# Patient Record
Sex: Female | Born: 1950
Health system: Southern US, Community
[De-identification: ages and names within clinical notes are randomized; demographics above are authoritative.]

## PROBLEM LIST (undated history)

## (undated) DIAGNOSIS — Z789 Other specified health status: Secondary | ICD-10-CM

## (undated) DIAGNOSIS — C801 Malignant (primary) neoplasm, unspecified: Secondary | ICD-10-CM

## (undated) DIAGNOSIS — Z923 Personal history of irradiation: Secondary | ICD-10-CM

## (undated) HISTORY — DX: Malignant (primary) neoplasm, unspecified: C80.1

## (undated) HISTORY — PX: ROTATOR CUFF REPAIR: SHX139

---

## 1985-07-19 HISTORY — PX: CHOLECYSTECTOMY: SHX55

## 1998-11-14 ENCOUNTER — Other Ambulatory Visit: Admission: RE | Admit: 1998-11-14 | Discharge: 1998-11-14 | Payer: Self-pay | Admitting: *Deleted

## 1998-12-30 ENCOUNTER — Ambulatory Visit (HOSPITAL_BASED_OUTPATIENT_CLINIC_OR_DEPARTMENT_OTHER): Admission: RE | Admit: 1998-12-30 | Discharge: 1998-12-30 | Payer: Self-pay | Admitting: Orthopedic Surgery

## 1999-08-26 ENCOUNTER — Other Ambulatory Visit: Admission: RE | Admit: 1999-08-26 | Discharge: 1999-08-26 | Payer: Self-pay | Admitting: Gynecology

## 1999-09-11 ENCOUNTER — Ambulatory Visit (HOSPITAL_COMMUNITY): Admission: RE | Admit: 1999-09-11 | Discharge: 1999-09-11 | Payer: Self-pay | Admitting: Gastroenterology

## 1999-09-18 ENCOUNTER — Encounter: Payer: Self-pay | Admitting: Gynecology

## 1999-09-18 ENCOUNTER — Encounter: Admission: RE | Admit: 1999-09-18 | Discharge: 1999-09-18 | Payer: Self-pay | Admitting: *Deleted

## 2000-09-14 ENCOUNTER — Other Ambulatory Visit: Admission: RE | Admit: 2000-09-14 | Discharge: 2000-09-14 | Payer: Self-pay | Admitting: Gynecology

## 2000-09-21 ENCOUNTER — Encounter: Admission: RE | Admit: 2000-09-21 | Discharge: 2000-09-21 | Payer: Self-pay | Admitting: Gynecology

## 2000-09-21 ENCOUNTER — Encounter: Payer: Self-pay | Admitting: Gynecology

## 2000-10-27 ENCOUNTER — Encounter (INDEPENDENT_AMBULATORY_CARE_PROVIDER_SITE_OTHER): Payer: Self-pay

## 2000-10-27 ENCOUNTER — Other Ambulatory Visit: Admission: RE | Admit: 2000-10-27 | Discharge: 2000-10-27 | Payer: Self-pay | Admitting: Gynecology

## 2001-09-19 ENCOUNTER — Other Ambulatory Visit: Admission: RE | Admit: 2001-09-19 | Discharge: 2001-09-19 | Payer: Self-pay | Admitting: Gynecology

## 2001-09-26 ENCOUNTER — Encounter: Payer: Self-pay | Admitting: Gynecology

## 2001-09-26 ENCOUNTER — Encounter: Admission: RE | Admit: 2001-09-26 | Discharge: 2001-09-26 | Payer: Self-pay | Admitting: Gynecology

## 2002-09-27 ENCOUNTER — Other Ambulatory Visit: Admission: RE | Admit: 2002-09-27 | Discharge: 2002-09-27 | Payer: Self-pay | Admitting: Gynecology

## 2002-10-09 ENCOUNTER — Encounter: Payer: Self-pay | Admitting: Gynecology

## 2002-10-09 ENCOUNTER — Encounter: Admission: RE | Admit: 2002-10-09 | Discharge: 2002-10-09 | Payer: Self-pay | Admitting: Gynecology

## 2002-12-27 ENCOUNTER — Encounter: Payer: Self-pay | Admitting: Orthopedic Surgery

## 2002-12-27 ENCOUNTER — Encounter: Admission: RE | Admit: 2002-12-27 | Discharge: 2002-12-27 | Payer: Self-pay | Admitting: Orthopedic Surgery

## 2003-01-08 ENCOUNTER — Ambulatory Visit: Admission: RE | Admit: 2003-01-08 | Discharge: 2003-01-08 | Payer: Self-pay | Admitting: Internal Medicine

## 2003-10-10 ENCOUNTER — Encounter: Admission: RE | Admit: 2003-10-10 | Discharge: 2003-10-10 | Payer: Self-pay | Admitting: Gynecology

## 2003-10-23 ENCOUNTER — Other Ambulatory Visit: Admission: RE | Admit: 2003-10-23 | Discharge: 2003-10-23 | Payer: Self-pay | Admitting: Gynecology

## 2004-10-21 ENCOUNTER — Other Ambulatory Visit: Admission: RE | Admit: 2004-10-21 | Discharge: 2004-10-21 | Payer: Self-pay | Admitting: Gynecology

## 2004-10-21 ENCOUNTER — Encounter: Admission: RE | Admit: 2004-10-21 | Discharge: 2004-10-21 | Payer: Self-pay | Admitting: Gynecology

## 2005-10-28 ENCOUNTER — Encounter: Admission: RE | Admit: 2005-10-28 | Discharge: 2005-10-28 | Payer: Self-pay | Admitting: Gynecology

## 2005-11-04 ENCOUNTER — Other Ambulatory Visit: Admission: RE | Admit: 2005-11-04 | Discharge: 2005-11-04 | Payer: Self-pay | Admitting: Gynecology

## 2006-10-31 ENCOUNTER — Encounter: Admission: RE | Admit: 2006-10-31 | Discharge: 2006-10-31 | Payer: Self-pay | Admitting: Gynecology

## 2006-11-08 ENCOUNTER — Other Ambulatory Visit: Admission: RE | Admit: 2006-11-08 | Discharge: 2006-11-08 | Payer: Self-pay | Admitting: Gynecology

## 2007-11-02 ENCOUNTER — Encounter: Admission: RE | Admit: 2007-11-02 | Discharge: 2007-11-02 | Payer: Self-pay | Admitting: Gynecology

## 2007-11-09 ENCOUNTER — Other Ambulatory Visit: Admission: RE | Admit: 2007-11-09 | Discharge: 2007-11-09 | Payer: Self-pay | Admitting: Gynecology

## 2008-11-04 ENCOUNTER — Encounter: Admission: RE | Admit: 2008-11-04 | Discharge: 2008-11-04 | Payer: Self-pay | Admitting: Gynecology

## 2009-11-12 ENCOUNTER — Encounter: Admission: RE | Admit: 2009-11-12 | Discharge: 2009-11-12 | Payer: Self-pay | Admitting: Gynecology

## 2010-07-19 DIAGNOSIS — C50919 Malignant neoplasm of unspecified site of unspecified female breast: Secondary | ICD-10-CM

## 2010-07-19 DIAGNOSIS — C801 Malignant (primary) neoplasm, unspecified: Secondary | ICD-10-CM

## 2010-07-19 HISTORY — DX: Malignant neoplasm of unspecified site of unspecified female breast: C50.919

## 2010-07-19 HISTORY — DX: Malignant (primary) neoplasm, unspecified: C80.1

## 2010-08-09 ENCOUNTER — Encounter: Payer: Self-pay | Admitting: Gynecology

## 2010-10-07 ENCOUNTER — Ambulatory Visit: Payer: Self-pay | Admitting: Sports Medicine

## 2010-10-13 ENCOUNTER — Other Ambulatory Visit: Payer: Self-pay | Admitting: Gynecology

## 2010-10-13 DIAGNOSIS — Z1231 Encounter for screening mammogram for malignant neoplasm of breast: Secondary | ICD-10-CM

## 2010-10-21 ENCOUNTER — Ambulatory Visit: Payer: Self-pay | Admitting: Sports Medicine

## 2010-10-21 ENCOUNTER — Encounter: Payer: Self-pay | Admitting: Sports Medicine

## 2010-10-21 ENCOUNTER — Ambulatory Visit (INDEPENDENT_AMBULATORY_CARE_PROVIDER_SITE_OTHER): Payer: 59 | Admitting: Sports Medicine

## 2010-10-21 VITALS — BP 120/70 | Ht 67.0 in | Wt 142.0 lb

## 2010-10-21 DIAGNOSIS — M7661 Achilles tendinitis, right leg: Secondary | ICD-10-CM | POA: Insufficient documentation

## 2010-10-21 DIAGNOSIS — M7660 Achilles tendinitis, unspecified leg: Secondary | ICD-10-CM

## 2010-10-21 MED ORDER — KETOPROFEN POWD: Status: DC

## 2010-10-21 NOTE — Progress Notes (Signed)
  Subjective:    Patient ID: Mary Bartlett, female    DOB: 08-Sep-1950, 60 y.o.   MRN: [id number]  HPI Plays tennis and walks;  Used to run. Walks 3 to 5 days per week 4 miles Plays tennis once weekly usually on hard courts Some golf and body pump and 1 day of weights  RT AT has been hurting for 8 mos or so Husband is orthopedist so she was getting some iontophoresis and icing at his office Felt better in office but is having more symptoms and not getting complete relief with physical therapy.  Started training for [employer] w more walking and this flared somewhat.  She notices a tender nodule on her right Achilles that has been there for an indefinite period of time  Review of Systems Hx of Rotator cuff repair RT and LT Hx of PF on right she thinks years ago  Only meds calcium and vitamins     Objective:   Physical Exam    patient is pleasant cooperative and in no acute distress.  Leg lengths equal. Alignment is good. Walking gait is normal.  Right Achilles tendon appears normal at the insertion in the calf muscle appears normal. However 5 cm above the insertion of the heel there is the beginning of a moderately tender nodule that is thickened and extends for 1-1/2 cm the length of the Achilles. Left Achilles tendon appears normal at the insertion and the calf muscle appears normal. There is some slight thickening about 4-5 cm above the heel insertion but no nodule is appreciated. There is no tenderness to squeeze.  Foot and arch shape are normal. Posterior tibialis function is intact. Lower extremity strength including hip abduction is excellent.  Of interest she has limited plantar flexion of the right foot when compared to the left but dorsiflexion is normal bilaterally. The plantar fascial insertion of both heels is nontender today.    Musculoskeletal ultrasound  On the right Achilles tendon at the insertion the AP left is 0.38 cm. At 5 cm above the heel there is a  nodule that is 0.59 cm on the AP with. In the central portion of this nodule is a linear calcification. This is noted on both long and transverse scan. There are some small areas of hypoechoic change but no frank tears appreciated. There is no abnormal Doppler flow within the tendon but there is some increased Doppler flow in the soft tissue surrounding the nodule.  Comparison the left Achilles tendon is 0.43 at the insertion and is only 0.46 at 5 cm above the insertion with no nodular thickening noted and no calcification.  Of interest is that the right plantar fascia still demonstrates a spur on scanning and some calcification within the plantar fascia but its thickness is normal at 0.4 left plantar fascia is normal as well.    Assessment & Plan:

## 2010-11-16 ENCOUNTER — Ambulatory Visit
Admission: RE | Admit: 2010-11-16 | Discharge: 2010-11-16 | Disposition: A | Discharge status: Home / Self Care | Payer: 59 | Source: Ambulatory Visit | Attending: Gynecology | Admitting: Gynecology

## 2010-11-16 DIAGNOSIS — Z1231 Encounter for screening mammogram for malignant neoplasm of breast: Secondary | ICD-10-CM

## 2010-11-17 ENCOUNTER — Other Ambulatory Visit: Payer: Self-pay | Admitting: Gynecology

## 2010-11-17 DIAGNOSIS — R928 Other abnormal and inconclusive findings on diagnostic imaging of breast: Secondary | ICD-10-CM

## 2010-11-17 HISTORY — PX: BREAST SURGERY: SHX581

## 2010-11-18 ENCOUNTER — Ambulatory Visit
Admission: RE | Admit: 2010-11-18 | Discharge: 2010-11-18 | Disposition: A | Discharge status: Home / Self Care | Payer: 59 | Source: Ambulatory Visit | Attending: Gynecology | Admitting: Gynecology

## 2010-11-18 ENCOUNTER — Other Ambulatory Visit: Payer: Self-pay | Admitting: Gynecology

## 2010-11-18 ENCOUNTER — Other Ambulatory Visit: Payer: Self-pay | Admitting: Diagnostic Radiology

## 2010-11-18 DIAGNOSIS — C50119 Malignant neoplasm of central portion of unspecified female breast: Secondary | ICD-10-CM | POA: Insufficient documentation

## 2010-11-18 DIAGNOSIS — R928 Other abnormal and inconclusive findings on diagnostic imaging of breast: Secondary | ICD-10-CM

## 2010-11-18 HISTORY — PX: BREAST BIOPSY: SHX20

## 2010-11-19 ENCOUNTER — Other Ambulatory Visit: Payer: Self-pay | Admitting: Gynecology

## 2010-11-19 DIAGNOSIS — C50912 Malignant neoplasm of unspecified site of left female breast: Secondary | ICD-10-CM

## 2010-11-22 ENCOUNTER — Ambulatory Visit
Admission: RE | Admit: 2010-11-22 | Discharge: 2010-11-22 | Disposition: A | Discharge status: Home / Self Care | Payer: 59 | Source: Ambulatory Visit | Attending: Gynecology | Admitting: Gynecology

## 2010-11-22 DIAGNOSIS — C50912 Malignant neoplasm of unspecified site of left female breast: Secondary | ICD-10-CM

## 2010-11-22 MED ORDER — GADOBENATE DIMEGLUMINE 529 MG/ML IV SOLN: 13.0000 mL | Freq: Once | INTRAVENOUS | Status: AC | PRN

## 2010-11-22 MED ORDER — GADOBENATE DIMEGLUMINE 529 MG/ML IV SOLN: 14.0000 mL | Freq: Once | INTRAVENOUS | Status: DC | PRN

## 2010-11-25 ENCOUNTER — Other Ambulatory Visit: Payer: Self-pay | Admitting: Surgery

## 2010-11-25 DIAGNOSIS — C50912 Malignant neoplasm of unspecified site of left female breast: Secondary | ICD-10-CM

## 2010-11-27 ENCOUNTER — Encounter (INDEPENDENT_AMBULATORY_CARE_PROVIDER_SITE_OTHER): Payer: Self-pay | Admitting: Surgery

## 2010-11-27 ENCOUNTER — Other Ambulatory Visit: Payer: Self-pay | Admitting: Surgery

## 2010-11-27 ENCOUNTER — Encounter (HOSPITAL_BASED_OUTPATIENT_CLINIC_OR_DEPARTMENT_OTHER)
Admission: RE | Admit: 2010-11-27 | Discharge: 2010-11-27 | Disposition: A | Discharge status: Home / Self Care | Payer: 59 | Source: Ambulatory Visit | Attending: Surgery | Admitting: Surgery

## 2010-11-27 ENCOUNTER — Ambulatory Visit
Admission: RE | Admit: 2010-11-27 | Discharge: 2010-11-27 | Disposition: A | Discharge status: Home / Self Care | Payer: 59 | Source: Ambulatory Visit | Attending: Surgery | Admitting: Surgery

## 2010-11-27 DIAGNOSIS — Z01811 Encounter for preprocedural respiratory examination: Secondary | ICD-10-CM

## 2010-11-27 LAB — CBC
MCV: 91.4 fL (ref 78.0–100.0)
Platelets: 195 10*3/uL (ref 150–400)
RDW: 12.2 % (ref 11.5–15.5)
WBC: 5.3 10*3/uL (ref 4.0–10.5)

## 2010-11-27 LAB — COMPREHENSIVE METABOLIC PANEL
ALT: 20 U/L (ref 0–35)
Albumin: 3.8 g/dL (ref 3.5–5.2)
Calcium: 9.5 mg/dL (ref 8.4–10.5)
Chloride: 105 mEq/L (ref 96–112)
Creatinine, Ser: 0.68 mg/dL (ref 0.4–1.2)
Sodium: 141 mEq/L (ref 135–145)
Total Bilirubin: 0.5 mg/dL (ref 0.3–1.2)
Total Protein: 6.5 g/dL (ref 6.0–8.3)

## 2010-11-27 LAB — DIFFERENTIAL
Basophils Absolute: 0 10*3/uL (ref 0.0–0.1)
Eosinophils Relative: 2 % (ref 0–5)
Monocytes Absolute: 0.4 10*3/uL (ref 0.1–1.0)

## 2010-12-01 ENCOUNTER — Ambulatory Visit
Admission: RE | Admit: 2010-12-01 | Discharge: 2010-12-01 | Disposition: A | Discharge status: Home / Self Care | Payer: 59 | Source: Ambulatory Visit | Attending: Surgery | Admitting: Surgery

## 2010-12-01 ENCOUNTER — Other Ambulatory Visit: Payer: Self-pay | Admitting: Surgery

## 2010-12-01 ENCOUNTER — Ambulatory Visit (HOSPITAL_BASED_OUTPATIENT_CLINIC_OR_DEPARTMENT_OTHER)
Admission: RE | Admit: 2010-12-01 | Discharge: 2010-12-01 | Disposition: A | Discharge status: Home / Self Care | Payer: 59 | Source: Ambulatory Visit | Attending: Surgery | Admitting: Surgery

## 2010-12-01 DIAGNOSIS — Z01812 Encounter for preprocedural laboratory examination: Secondary | ICD-10-CM | POA: Insufficient documentation

## 2010-12-01 DIAGNOSIS — Z01810 Encounter for preprocedural cardiovascular examination: Secondary | ICD-10-CM | POA: Insufficient documentation

## 2010-12-01 DIAGNOSIS — D0590 Unspecified type of carcinoma in situ of unspecified breast: Secondary | ICD-10-CM | POA: Insufficient documentation

## 2010-12-01 DIAGNOSIS — C50912 Malignant neoplasm of unspecified site of left female breast: Secondary | ICD-10-CM

## 2010-12-01 HISTORY — PX: BREAST LUMPECTOMY: SHX2

## 2010-12-02 ENCOUNTER — Ambulatory Visit: Payer: 59 | Admitting: Sports Medicine

## 2010-12-03 NOTE — Op Note (Signed)
NAME:  Mary Bartlett, Mary Bartlett                ACCOUNT NO.:  [id number]  MEDICAL RECORD NO.:  [id number]          PATIENT TYPE:  LOCATION:                                 FACILITY:  PHYSICIAN:  Currie Paris, M.D.   DATE OF BIRTH:  DATE OF PROCEDURE:  12/01/2010 DATE OF DISCHARGE:                              OPERATIVE REPORT   PREOPERATIVE DIAGNOSIS:  Ductal carcinoma in situ, central left breast, clinical stage 0.  POSTOPERATIVE DIAGNOSIS:  Ductal carcinoma in situ, central left breast, clinical stage 0.  PROCEDURE:  Needle-guided left lumpectomy.  SURGEON:  Currie Paris, MD  ANESTHESIA:  General.  CLINICAL HISTORY:  This is a 60 year old lady recently had a mammogram and some calcifications seen at about the 12 o'clock position at the areolar margin and a biopsy shows DCIS.  After an MRI confirmed the area, we elected to proceed to the lumpectomy.  We noted that this might well extend to the edge of the nipple and at some point require complete excision of the nipple-areolar complex, but we elected initially to do a lumpectomy preserving that and seeing if we could get a negative margin.  DESCRIPTION OF PROCEDURE:  The patient was seen in the holding area, and she had no further questions.  The guidewire had been placed, and I reviewed the films.  I initialed the left breast as the operative side.  The patient was taken to the operating room and after satisfactory general anesthesia had been obtained, the left breast was prepped and draped and the time-out was done.  The guidewire entered laterally and tracked medially.  Actually, I entered the skin about 1.5 cm lateral to the areolar edge and I thought the tip was likely along the superior edge of the areola.  I made a curvilinear incision starting at about the 4 o'clock position at the areolar edge extending around to about the 10 o'clock position.  I raised a thin flap medially and disconnected the skin of the  areola and nipple from the underlying breast tissue.  I then extended the flap laterally and manipulated the guidewire into the wound.  I then made the flap superiorly until I got up to where the old biopsy site was, which was at the 12 o'clock position and then extended the flap medially until I got beyond the edge of the areola.  I then began dividing the tissue down towards the chest wall.  There was a palpable thickening in the tissue, which I thought was part of her ecchymosis from the biopsy.  I got down to the chest wall first superiorly, then medially, then inferiorly, and then started coming up under that area of tissue along the chest wall.  I then went down laterally to get down to the chest wall and finally disconnected the remaining attachments posteriorly.  I spent a few minutes marking the specimen for orientation purposes. This was then sent to pathology after a specimen mammogram showed the clip in the middle of the specimen.  I put about 30 mL of 0.25% plain Marcaine in.  I made sure everything was dry and irrigated.  I raised some flaps of breast tissue off the chest wall to try to diminish the size of the breast cavity.  I put clips and marked the margins of the excision.  I then closed the deeper breast tissue with some 3-0 Vicryl figure-of-eight sutures.  I left a little bit of the cavity.  I then closed the skin with 3-0 Vicryl and 4- 0 Monocryl subcuticular plus Dermabond.  The patient tolerated the procedure well and there were no complications.  Counts were correct.     Currie Paris, M.D.     CJS/MEDQ  D:  12/01/2010  T:  12/02/2010  Job:  [id number]  Electronically Signed by Cyndia Bent M.D. on 12/03/2010 03:40:40 PM

## 2010-12-09 ENCOUNTER — Ambulatory Visit (HOSPITAL_COMMUNITY): Admission: RE | Admit: 2010-12-09 | Payer: 59 | Source: Ambulatory Visit | Admitting: Surgery

## 2010-12-15 ENCOUNTER — Ambulatory Visit (HOSPITAL_BASED_OUTPATIENT_CLINIC_OR_DEPARTMENT_OTHER)
Admission: RE | Admit: 2010-12-15 | Discharge: 2010-12-15 | Disposition: A | Discharge status: Home / Self Care | Payer: 59 | Source: Ambulatory Visit | Attending: Surgery | Admitting: Surgery

## 2010-12-15 ENCOUNTER — Other Ambulatory Visit: Payer: Self-pay | Admitting: Surgery

## 2010-12-15 DIAGNOSIS — Z01812 Encounter for preprocedural laboratory examination: Secondary | ICD-10-CM | POA: Insufficient documentation

## 2010-12-15 DIAGNOSIS — D0590 Unspecified type of carcinoma in situ of unspecified breast: Secondary | ICD-10-CM | POA: Insufficient documentation

## 2010-12-15 LAB — POCT HEMOGLOBIN-HEMACUE: Hemoglobin: 14.1 g/dL (ref 12.0–15.0)

## 2010-12-18 NOTE — Op Note (Signed)
NAMEMABEL, ROLL                ACCOUNT NO.:  [id number]  MEDICAL RECORD NO.:  [id number]           PATIENT TYPE:  LOCATION:                                 FACILITY:  PHYSICIAN:  Currie Paris, M.D.DATE OF BIRTH:  July 16, 1951  DATE OF PROCEDURE:  12/15/2010 DATE OF DISCHARGE:                              OPERATIVE REPORT   PREOPERATIVE DIAGNOSIS:  Ductal carcinoma in situ left breast central status post lumpectomy with microscopically positive margin, clinical stage 0.  POSTOPERATIVE DIAGNOSIS:  Ductal carcinoma in situ left breast central status post lumpectomy with microscopically positive margin, clinical stage 0.  PROCEDURE:  Left central partial mastectomy.  SURGEON:  Currie Paris, MD  ANESTHESIA:  General.  CLINICAL HISTORY:  This is a 60 year old who underwent a needle-guided lumpectomy for some DCIS that was near the subareolar area. Unfortunately, she had microscopic foci at the margin near the nipple as well as a little bit of a medial margin.  After discussion with the patient and with Radiation/Oncology, we elected to proceed to a re- excision to include the nipple-areolar complex which we had tried to preserve on the initial operative intervention.  DESCRIPTION OF PROCEDURE:  I saw the patient in the holding area and she had no further questions.  I marked the left side as the operative side.  The patient was taken to the operating room, and after satisfactory general (LMA) anesthesia had been obtained time-out was done.  The breast was prepped and draped.  I made an elliptical incision going around the nipple, trying to take very little excess skin.  I then divided the breast tissue straight down towards the chest wall along the inferior edge since the tumor was along the superior margin and I thought we would have plenty of margin inferiorly.  I then raised a little bit of flap superiorly and got down and beyond my prior lumpectomy so that  I was trying to include superior margin of my old lumpectomy as well medial to the old lumpectomy cavity as well then down to the chest wall and then out laterally again going very short distance laterally beyond the areola.  The lumpectomy cavity did disrupt somewhat, but I think I had the entire cavity excised.  I disrupted more of the superior edge and the inferior and medial edges where the areas of concern.  I made sure everything was dry.  I put some 0.25% plain Marcaine in to help with postop pain relief.  I tried to elevate the breast tissue again off the muscle and closed some of the deeper layer.  The superficial layer I had trouble closing just with normal sutures because of the friable nature of the postsurgical reaction superiorly, but I was able to put a layer of subcu Vicryl in followed by a subcuticular 4-0 Monocryl.  I did raise a fairly long inferior flap to try to prevent any tethering of the breast.  The patient also had clips placed prior to closer to help with marking the margins of excision.  The patient tolerated the procedure well.  There were no operative complications.  All  counts were correct.     Currie Paris, M.D.     CJS/MEDQ  D:  12/15/2010  T:  12/15/2010  Job:  [id number]  Electronically Signed by Cyndia Bent M.D. on 12/18/2010 01:07:03 PM

## 2010-12-24 ENCOUNTER — Ambulatory Visit
Admission: RE | Admit: 2010-12-24 | Discharge: 2010-12-24 | Disposition: A | Discharge status: Home / Self Care | Payer: 59 | Source: Ambulatory Visit | Attending: Radiation Oncology | Admitting: Radiation Oncology

## 2010-12-24 DIAGNOSIS — L538 Other specified erythematous conditions: Secondary | ICD-10-CM | POA: Insufficient documentation

## 2010-12-24 DIAGNOSIS — Z510 Encounter for antineoplastic radiation therapy: Secondary | ICD-10-CM | POA: Insufficient documentation

## 2010-12-24 DIAGNOSIS — R5381 Other malaise: Secondary | ICD-10-CM | POA: Insufficient documentation

## 2010-12-24 DIAGNOSIS — L589 Radiodermatitis, unspecified: Secondary | ICD-10-CM | POA: Insufficient documentation

## 2010-12-24 DIAGNOSIS — R5383 Other fatigue: Secondary | ICD-10-CM | POA: Insufficient documentation

## 2010-12-24 DIAGNOSIS — C50119 Malignant neoplasm of central portion of unspecified female breast: Secondary | ICD-10-CM | POA: Insufficient documentation

## 2010-12-24 DIAGNOSIS — L299 Pruritus, unspecified: Secondary | ICD-10-CM | POA: Insufficient documentation

## 2010-12-24 DIAGNOSIS — Y842 Radiological procedure and radiotherapy as the cause of abnormal reaction of the patient, or of later complication, without mention of misadventure at the time of the procedure: Secondary | ICD-10-CM | POA: Insufficient documentation

## 2011-02-18 ENCOUNTER — Encounter (HOSPITAL_BASED_OUTPATIENT_CLINIC_OR_DEPARTMENT_OTHER): Payer: 59 | Admitting: Oncology

## 2011-02-18 ENCOUNTER — Other Ambulatory Visit: Payer: Self-pay | Admitting: Oncology

## 2011-02-18 DIAGNOSIS — D0590 Unspecified type of carcinoma in situ of unspecified breast: Secondary | ICD-10-CM

## 2011-02-18 DIAGNOSIS — Z170 Estrogen receptor positive status [ER+]: Secondary | ICD-10-CM

## 2011-02-18 DIAGNOSIS — C50119 Malignant neoplasm of central portion of unspecified female breast: Secondary | ICD-10-CM

## 2011-02-18 LAB — COMPREHENSIVE METABOLIC PANEL
ALT: 24 U/L (ref 0–35)
AST: 30 U/L (ref 0–37)
Albumin: 4.5 g/dL (ref 3.5–5.2)
Alkaline Phosphatase: 109 U/L (ref 39–117)
Calcium: 10.4 mg/dL (ref 8.4–10.5)
Chloride: 105 mEq/L (ref 96–112)
Potassium: 4.4 mEq/L (ref 3.5–5.3)
Sodium: 141 mEq/L (ref 135–145)

## 2011-02-18 LAB — CBC WITH DIFFERENTIAL/PLATELET
BASO%: 0.6 % (ref 0.0–2.0)
Basophils Absolute: 0 10*3/uL (ref 0.0–0.1)
EOS%: 1.4 % (ref 0.0–7.0)
HGB: 14.1 g/dL (ref 11.6–15.9)
MCH: 32.6 pg (ref 25.1–34.0)
MCHC: 34.5 g/dL (ref 31.5–36.0)
MCV: 94.4 fL (ref 79.5–101.0)
MONO%: 8.9 % (ref 0.0–14.0)
RBC: 4.32 10*6/uL (ref 3.70–5.45)
RDW: 12.4 % (ref 11.2–14.5)
lymph#: 1.7 10*3/uL (ref 0.9–3.3)

## 2011-03-02 ENCOUNTER — Encounter (INDEPENDENT_AMBULATORY_CARE_PROVIDER_SITE_OTHER): Payer: Self-pay | Admitting: Surgery

## 2011-03-02 ENCOUNTER — Ambulatory Visit (INDEPENDENT_AMBULATORY_CARE_PROVIDER_SITE_OTHER): Payer: 59 | Admitting: Surgery

## 2011-03-02 VITALS — BP 122/84 | HR 64 | Temp 97.6°F

## 2011-03-02 DIAGNOSIS — C50119 Malignant neoplasm of central portion of unspecified female breast: Secondary | ICD-10-CM

## 2011-03-02 NOTE — Progress Notes (Signed)
CC: Followup after radiation for left lumpectomy  HPI: This patient comes in for post op follow-up. She underwent left lumpectomy with subsequent excision of nipple areolar complex on may 29th, 2012. She feels that she is doing well.  PE: General: The patient appears to be healthy, NAD  : Breast: The right breast is normal. The left breast status post lumpectomy with absence of the nipple areolar complex. The incision is well-healed. There is a little bit of nodularity at the incision especially medially, the skin has some increased pigmentation. Lymphatics: No axillary or supraclavicular adenopathy noted  IMPRESSION: The patient is doing well S/P lumpectomy and radiation tear.  DATA REVIWED: I reviewed the notes from Dr. Darnelle Catalan. office. A planned followup only in no neoadjuvant chemotherapy  PLAN: I will see back in nine months

## 2011-03-17 ENCOUNTER — Ambulatory Visit: Payer: 59 | Attending: Radiation Oncology | Admitting: Radiation Oncology

## 2011-03-30 ENCOUNTER — Ambulatory Visit
Admission: RE | Admit: 2011-03-30 | Discharge: 2011-03-30 | Disposition: A | Discharge status: Home / Self Care | Payer: 59 | Source: Ambulatory Visit | Attending: Radiation Oncology | Admitting: Radiation Oncology

## 2011-05-26 NOTE — Progress Notes (Signed)
CC:   Oliver Hum, MD Pearla Dubonnet, M.D. Currie Paris, M.D. Maryln Gottron, M.D. Leatha Gilding. Mezer, M.D. Griffith Citron, M.D.  HISTORY OF PRESENT ILLNESS:  Mary Bartlett is a 60 year old [place] woman referred by Dr. Mayford Knife for evaluation and treatment in the setting of newly diagnosed breast cancer.  Ruqaya had a screening mammography at the breast center 11/16/2010 which showed some new microcalcifications in the left breast.  She was recalled for additional views on 11/18/2010, and Dr. Mayford Knife was able to demonstrate a cluster of heterogeneous calcifications spanning 3 mm in the subareolar area of the left breast.  A biopsy was performed the same day and showed ([id number]) high-grade ductal carcinoma in situ which was estrogen receptor positive at 20%, progesterone receptor negative.  With this information, the patient was referred to Dr. Jamey Ripa and bilateral breast MRIs were obtained 11/23/2010.  This showed an area of clumped nodular enhancement in the central left breast measuring 2.5 cm. There were no other abnormal areas of concern.  With this information, the patient proceeded to a left lumpectomy on 12/01/2010.  The pathology from that procedure ( [id number]) showed high- grade ductal carcinoma in situ.  Margins, however, were focally positive anteriorly and medially.  Accordingly,  on 05/29, the patient underwent re-resection for margin clearance and this was successfully accomplished ([id number]).  There was no additional tumor in the sample although an incidental benign intraductal papilloma was noted.  With this information, the patient was referred to Dr. Dayton Scrape, and she proceeded to post lumpectomy radiation of the left breast which was completed on 02/12/2011.  The patient now presents for consideration of systemic treatment.  PAST MEDICAL HISTORY:  Significant for bilateral rotator cuff repair, remote history of cholecystectomy, history of  migraines which she describes as rare and usually with left head predominance.  She has a history of premature beats which were evaluated by Dr. Deborah Chalk in the past.  These have been asymptomatic although 1 time while playing tennis, she felt she had to sit down and was having some palpitations at that time.  She tells me she drinks perhaps 2 cups of coffee daily.  The past medical history was otherwise noncontributory.  FAMILY HISTORY:  The patient's father died following a stroke at age 18. The patient's mother had a history of aortic valve stenosis and died at age 15.  She had 1 sibling, a brother, who had Hodgkin's disease at age 69, treated among other ways with mantle radiation.  He developed a mesothelioma at age 66.  In addition to the radiation, there had been some asbestos exposure.  GYNECOLOGIC HISTORY:  She had menarche at age 40, menopause at age 71. She is GX P4, first pregnancy to term at age 1.  She never took hormone replacement.  SOCIAL HISTORY:  Tajah worked originally as an Dentist.  She is married to [employer] and they have 4 children ranging in age from 13- 29.  She has 2 grandchildren and attends [employer].  HEALTH MAINTENANCE:  She smoked minimally while at college.  She drinks socially perhaps 3-4 glasses of wine a week.  She had her most recent colonoscopy under [facility] less and 5 years ago.  She had a bone density 2-3 years ago which she tells me was "excellent."  Her cholesterol is "fine."  Her Pap smear is up-to-date.  She does have advanced directives in place.  ALLERGIES:  No known drug allergies.  MEDICATIONS: 1. Calcium with  vitamin D. 2. Multivitamin daily.  REVIEW OF SYSTEMS:  She did very well with the surgery and also did very well with the radiation with only some "red skin" as her chief symptoms. There was mild to no fatigue.  She is moderately well satisfied with the cosmetic result of her surgery.  She exercises  regularly.  PHYSICAL EXAM:  General:  Shows a middle-aged white woman who is 5 feet 7 inches tall and weighs 141 pounds. Vital Signs:  Temperature is 98.2, pulse 64, respiratory rate 20 and blood pressure 136/83.  HEENT:  Sclerae are not icteric.  Oropharynx is clear.  I have do not palpate any cervical, supraclavicular or axillary adenopathy.  Lungs:  No crackles or wheezes with excellent excursion. Heart:  Regular rate and rhythm.  I would do not appreciate a murmur or click.  Right breast has no suspicious findings.  Left breast status post central lumpectomy.  The incision has healed very nicely.  The left axilla is unremarkable.  There is nothing to suggest local recurrence. Abdomen:  Benign.  Musculoskeletal exam:  No focal spinal tenderness. No peripheral edema.  Neurologic:  Exam nonfocal.  LAB WORK:  Shows a white cell count of 6.4, hemoglobin 14.1, platelets 209,000.  CMET is normal with a creatinine of 0.83, normal liver function tests, normal albumin and calcium.  FILMS:  In addition to the films already described, she had a chest x- ray on 05/11 which was negative.  IMPRESSION AND PLAN:  This is a 60 year old [place] woman status post left central lumpectomy on 11/2010 for a high-grade ductal carcinoma in situ, with margins subsequently cleared, the tumor being estrogen receptor positive at 20%, progesterone receptor negative.  We discussed the possible benefits of anti estrogens including reduction of the residual risk of local recurrence from this tumor and also a reduction in the risk of a new breast cancer developing.  We also discussed the possible toxicity, side effects and complications of anti estrogens.  After this discussion, she was strongly leaning against proceeding with anti estrogens and my own feeling is that this is not at all unreasonable particularly given the fact that her tumor was only 20% estrogen receptor positive and that it was progesterone  receptor negative.  She has not decided what to do regarding reconstruction.  Rob, her husband, knows both Etter Sjogren and [employer] well, and they are aware of Madaline Savage at [facility] and Neysa Hotter at [facility] also.  At this point, she just wants to think about it.  I offered to release Juliet to Dr. Kevan Ny' care, but she would like to be followed here on a yearly basis, and I will see her again in  August of next year.  She will have repeat mammography next May.  She knows to call for any problems that may develop before the next visit.    ______________________________ Lowella Dell, M.D. GCM/MEDQ  D:  02/20/2011  T:  02/20/2011  Job:  [id number]

## 2011-10-05 ENCOUNTER — Other Ambulatory Visit: Payer: Self-pay | Admitting: Gynecology

## 2011-10-05 DIAGNOSIS — Z853 Personal history of malignant neoplasm of breast: Secondary | ICD-10-CM

## 2011-11-15 ENCOUNTER — Telehealth: Payer: Self-pay | Admitting: Oncology

## 2011-11-15 NOTE — Telephone Encounter (Signed)
Moved 8/8 appt to 8/12 @ 3:30 pm due to GM out of office. lmonvm for pt re change w/new d/t and confirmed 8/2 lb appt. Schedule mailed today.

## 2011-11-17 ENCOUNTER — Ambulatory Visit
Admission: RE | Admit: 2011-11-17 | Discharge: 2011-11-17 | Disposition: A | Discharge status: Home / Self Care | Payer: BC Managed Care – PPO | Source: Ambulatory Visit | Attending: Gynecology | Admitting: Gynecology

## 2011-11-17 DIAGNOSIS — Z853 Personal history of malignant neoplasm of breast: Secondary | ICD-10-CM

## 2011-12-09 ENCOUNTER — Encounter (INDEPENDENT_AMBULATORY_CARE_PROVIDER_SITE_OTHER): Payer: Self-pay | Admitting: Surgery

## 2011-12-09 ENCOUNTER — Ambulatory Visit (INDEPENDENT_AMBULATORY_CARE_PROVIDER_SITE_OTHER): Payer: BC Managed Care – PPO | Admitting: Surgery

## 2011-12-09 VITALS — BP 112/76 | HR 78 | Temp 99.1°F | Resp 14 | Ht 66.75 in | Wt 139.2 lb

## 2011-12-09 DIAGNOSIS — Z853 Personal history of malignant neoplasm of breast: Secondary | ICD-10-CM

## 2011-12-09 NOTE — Progress Notes (Signed)
NAME: Mary Bartlett       DOB: Jan 04, 1951           DATE: 12/09/2011       MRN: [id number]   Shawanna Zanders Hum is a 61 y.o.Marland Kitchenfemale who presents for routine followup of her Left breast cancer diagnosed in 2012 and treated with partial central mastectomy and radiation. No chemo, no hormones. She has no problems or concerns on either side.  PFSH: She has had no significant changes since the last visit here.  ROS: There have been no significant changes since the last visit here  EXAM: General: The patient is alert, oriented, generally healty appearing, NAD. Mood and affect are normal.  Breasts:  Right breast is normal. Left a little smaller, slightly tender, no radiation changes visible, no masses  Lymphatics: She has no axillary or supraclavicular adenopathy on either side.  Extremities: Full ROM of the surgical side with no lymphedema noted.  Data Reviewed: Mammograms OK  Impression: Doing well, with no evidence of recurrent cancer or new cancer  Plan: RTC 6bMonths

## 2012-02-17 ENCOUNTER — Other Ambulatory Visit: Payer: Self-pay | Admitting: *Deleted

## 2012-02-18 ENCOUNTER — Telehealth: Payer: Self-pay | Admitting: *Deleted

## 2012-02-18 ENCOUNTER — Other Ambulatory Visit: Payer: 59 | Admitting: Lab

## 2012-02-18 NOTE — Telephone Encounter (Signed)
Per orders cancelled patient appointment for 02-18-2012

## 2012-02-24 ENCOUNTER — Ambulatory Visit: Payer: 59 | Admitting: Oncology

## 2012-02-28 ENCOUNTER — Ambulatory Visit: Payer: 59 | Admitting: Oncology

## 2012-04-19 ENCOUNTER — Telehealth (INDEPENDENT_AMBULATORY_CARE_PROVIDER_SITE_OTHER): Payer: Self-pay | Admitting: General Surgery

## 2012-04-19 NOTE — Telephone Encounter (Signed)
Left message on machine for patient to call back and ask for me.   

## 2012-04-19 NOTE — Telephone Encounter (Signed)
Message copied by Liliana Cline on Wed Apr 19, 2012  5:13 PM ------      Message from: Rise Paganini      Created: Wed Apr 19, 2012  4:19 PM      Regarding: Jamey Ripa      Contact: [phone]       Patient has questions regarding the follow-up appointment. Please call to discuss. Thank you.

## 2012-05-12 ENCOUNTER — Encounter (HOSPITAL_BASED_OUTPATIENT_CLINIC_OR_DEPARTMENT_OTHER): Payer: Self-pay | Admitting: *Deleted

## 2012-05-12 NOTE — Progress Notes (Signed)
Very healthy-no cardiac or resp problems

## 2012-05-16 ENCOUNTER — Ambulatory Visit (INDEPENDENT_AMBULATORY_CARE_PROVIDER_SITE_OTHER)
Admission: RE | Admit: 2012-05-16 | Discharge: 2012-05-16 | Disposition: A | Discharge status: Home / Self Care | Payer: BC Managed Care – PPO | Source: Ambulatory Visit | Attending: Internal Medicine | Admitting: Internal Medicine

## 2012-05-16 ENCOUNTER — Ambulatory Visit (INDEPENDENT_AMBULATORY_CARE_PROVIDER_SITE_OTHER): Payer: BC Managed Care – PPO | Admitting: Internal Medicine

## 2012-05-16 ENCOUNTER — Telehealth: Payer: Self-pay | Admitting: Internal Medicine

## 2012-05-16 ENCOUNTER — Encounter: Payer: Self-pay | Admitting: Internal Medicine

## 2012-05-16 VITALS — BP 120/78 | HR 64 | Temp 98.2°F | Ht 67.0 in | Wt 144.0 lb

## 2012-05-16 DIAGNOSIS — R059 Cough, unspecified: Secondary | ICD-10-CM

## 2012-05-16 DIAGNOSIS — R05 Cough: Secondary | ICD-10-CM

## 2012-05-16 MED ORDER — OMEPRAZOLE 40 MG PO CPDR: 40.0000 mg | DELAYED_RELEASE_CAPSULE | Freq: Every day | ORAL | Status: DC

## 2012-05-16 MED ORDER — PREDNISONE (PAK) 10 MG PO TABS: ORAL_TABLET | ORAL | Status: DC

## 2012-05-16 MED ORDER — FAMOTIDINE 20 MG PO TABS: 20.0000 mg | ORAL_TABLET | Freq: Every day | ORAL | Status: DC

## 2012-05-16 MED ORDER — TRAMADOL HCL 50 MG PO TABS: ORAL_TABLET | ORAL | Status: DC

## 2012-05-16 NOTE — Patient Instructions (Addendum)
The key to effective treatment for your cough is eliminating the non-stop cycle of cough you're stuck in long enough to let your airway heal completely and then see if there is anything still making you cough once you stop the cough suppression, but this should take no more than 5 days to figuure out  First take delsym two tsp every 12 hours and supplement if needed with  tramadol 50 mg up to 1-2 every 4 hours to suppress the urge to cough. Swallowing water or using ice chips/non mint and menthol containing candies (such as lifesavers or sugarless jolly ranchers) are also effective.  You should rest your voice and avoid activities that you know make you cough.  Once you have eliminated the cough for 3 straight days try reducing the tramadol first,  then the delsym as tolerated.    Try prilosec 20mg   Take 2  30-60 min before first meal of the day and Pepcid 20 mg one bedtime until cough is completely gone for at least a week without the need for cough suppression   GERD (REFLUX)  is an extremely common cause of respiratory symptoms, many times with no significant heartburn at all.    It can be treated with medication, but also with lifestyle changes including avoidance of late meals, excessive alcohol, smoking cessation, and avoid fatty foods, chocolate, peppermint, colas, red wine, and acidic juices such as orange juice.  NO MINT OR MENTHOL PRODUCTS SO NO COUGH DROPS  USE SUGARLESS CANDY INSTEAD (jolley ranchers or Stover's or ICE)  NO OIL BASED VITAMINS - use powdered substitutes.     Prednisone 10 mg take  4 each am x 2 days,   2 each am x 2 days,  1 each am x2days and stop    Please remember to go to the x-ray department downstairs for your tests - we will call you with the results when they are available.

## 2012-05-16 NOTE — Assessment & Plan Note (Signed)
The most common causes of chronic cough in immunocompetent adults include the following: upper airway cough syndrome (UACS), previously referred to as postnasal drip syndrome (PNDS), which is caused by variety of rhinosinus conditions; (2) asthma; (3) GERD; (4) chronic bronchitis from cigarette smoking or other inhaled environmental irritants; (5) nonasthmatic eosinophilic bronchitis; and (6) bronchiectasis.   These conditions, singly or in combination, have accounted for up to 94% of the causes of chronic cough in prospective studies.   Other conditions have constituted no >6% of the causes in prospective studies These have included bronchogenic carcinoma, chronic interstitial pneumonia, sarcoidosis, left ventricular failure, ACEI-induced cough, and aspiration from a condition associated with pharyngeal dysfunction.   This is most likely  Classic Upper airway cough syndrome, so named because it's frequently impossible to sort out how much is  CR/sinusitis with freq throat clearing (which can be related to primary GERD)   vs  causing  secondary (" extra esophageal")  GERD from wide swings in gastric pressure that occur with throat clearing, often  promoting self use of mint and menthol lozenges that reduce the lower esophageal sphincter tone and exacerbate the problem further in a cyclical fashion.   These are the same pts (now being labeled as having "irritable larynx syndrome" by some cough centers) who not infrequently have a history of having failed to tolerate ace inhibitors,  dry powder inhalers or biphosphonates or report having atypical reflux symptoms that don't respond to standard doses of PPI , and are easily confused as having aecopd or asthma flares by even experienced allergists/ pulmonologists.  rec try to eliminate cycle of throat clearing leading to cough and rx gerd aggressively then return in 2 weeks if not better

## 2012-05-16 NOTE — Progress Notes (Signed)
Quick Note:  Spoke with pt and notified of results per Dr. Wert. Pt verbalized understanding and denied any questions.  ______ 

## 2012-05-16 NOTE — Telephone Encounter (Signed)
Called, spoke with pt.  Reports cough x 4-6 wks; worse in am and qhs.  Cough is nonprod and has an "itch" at base of chest.  Taking prilosec for approx 2-4 wks.  Has knee surgery scheduled for Thursday.  Requesting appt with MW -- scheduled with Dr. Sherene Sires for today at 10:45 am -- pt aware.

## 2012-05-16 NOTE — Progress Notes (Signed)
Subjective:    Patient ID: Mary Bartlett, female    DOB: August 08, 1950  MRN: [id number]  HPI  61 yowf min smoking hx with no sign allergy/asthma hx and previous cough ? Reflux related self referred 05/16/2012 for recurrent cough.  05/16/2012 1st pulmonary eval cc new  cough indolent onset x 6 weeks present daily mostly dry ? Better p prilosec 20 mg in am then again @ bedtime then gets worse again. Does seem worse p eating and using mint/menthol products. No overt HB or dysphagia or sinus complaints nor sob/ wheezing.    Also denies any obvious fluctuation of symptoms with weather or environmental changes or other aggravating or alleviating factors except as outlined above      Kouffman Reflux v Neurogenic Cough Differentiator Reflux Comments  Do you awaken from a sound sleep coughing violently?                            With trouble breathing? some   Do you have choking episodes when you cannot  Get enough air, gasping for air ?              no   Do you usually cough when you lie down into  The bed, or when you just lie down to rest ?                          yes   Do you usually cough after meals or eating?         Yes   Do you cough when (or after) you bend over?    no   GERD SCORE     Kouffman Reflux v Neurogenic Cough Differentiator Neurogenic   Do you more-or-less cough all day long? some   Does change of temperature make you cough? no   Does laughing or chuckling cause you to cough? yes   Do fumes (perfume, automobile fumes, burned  Toast, etc.,) cause you to cough ?      maybe   Does speaking, singing, or talking on the phone cause you to cough   ?               Yes    Neurogenic/Airway score        Review of Systems  Constitutional: Negative for fever, chills and unexpected weight change.  HENT: Negative for ear pain, nosebleeds, congestion, sore throat, rhinorrhea, sneezing, trouble swallowing, dental problem, voice change, postnasal drip and sinus pressure.   Eyes:  Negative for visual disturbance.  Respiratory: Positive for cough. Negative for choking and shortness of breath.   Cardiovascular: Negative for chest pain and leg swelling.  Gastrointestinal: Negative for vomiting, abdominal pain and diarrhea.  Genitourinary: Negative for difficulty urinating.  Musculoskeletal: Negative for arthralgias.  Skin: Negative for rash.  Neurological: Negative for tremors, syncope and headaches.  Hematological: Does not bruise/bleed easily.       Objective:   Physical Exam  Pleasant amb wf nad with freq throat clearling   05/16/2012 wt 144  HEENT: nl dentition, turbinates, and orophanx. Nl external ear canals without cough reflex   NECK :  without JVD/Nodes/TM/ nl carotid upstrokes bilaterally   LUNGS: no acc muscle use, clear to A and P bilaterally without cough on insp or exp maneuvers   CV:  RRR  no s3 or murmur or increase in P2, no edema   ABD:  soft and  nontender with nl excursion in the supine position. No bruits or organomegaly, bowel sounds nl  MS:  warm without deformities, calf tenderness, cyanosis or clubbing  SKIN: warm and dry without lesions    NEURO:  alert, approp, no deficits    CXR  05/16/2012 :    No active lung disease.        Assessment & Plan:

## 2012-05-18 ENCOUNTER — Encounter (HOSPITAL_BASED_OUTPATIENT_CLINIC_OR_DEPARTMENT_OTHER)
Admission: RE | Disposition: A | Discharge status: Home / Self Care | Payer: Self-pay | Source: Ambulatory Visit | Attending: Orthopedic Surgery

## 2012-05-18 ENCOUNTER — Encounter (HOSPITAL_BASED_OUTPATIENT_CLINIC_OR_DEPARTMENT_OTHER): Payer: Self-pay

## 2012-05-18 ENCOUNTER — Encounter (HOSPITAL_BASED_OUTPATIENT_CLINIC_OR_DEPARTMENT_OTHER): Payer: Self-pay | Admitting: Anesthesiology

## 2012-05-18 ENCOUNTER — Ambulatory Visit (HOSPITAL_BASED_OUTPATIENT_CLINIC_OR_DEPARTMENT_OTHER): Payer: BC Managed Care – PPO | Admitting: Anesthesiology

## 2012-05-18 ENCOUNTER — Ambulatory Visit (HOSPITAL_BASED_OUTPATIENT_CLINIC_OR_DEPARTMENT_OTHER)
Admission: RE | Admit: 2012-05-18 | Discharge: 2012-05-18 | Disposition: A | Discharge status: Home / Self Care | Payer: BC Managed Care – PPO | Source: Ambulatory Visit | Attending: Orthopedic Surgery | Admitting: Orthopedic Surgery

## 2012-05-18 DIAGNOSIS — X500XXA Overexertion from strenuous movement or load, initial encounter: Secondary | ICD-10-CM | POA: Insufficient documentation

## 2012-05-18 DIAGNOSIS — Y9323 Activity, snow (alpine) (downhill) skiing, snow boarding, sledding, tobogganing and snow tubing: Secondary | ICD-10-CM | POA: Insufficient documentation

## 2012-05-18 DIAGNOSIS — S83289A Other tear of lateral meniscus, current injury, unspecified knee, initial encounter: Secondary | ICD-10-CM | POA: Insufficient documentation

## 2012-05-18 DIAGNOSIS — Z9889 Other specified postprocedural states: Secondary | ICD-10-CM

## 2012-05-18 DIAGNOSIS — S83509A Sprain of unspecified cruciate ligament of unspecified knee, initial encounter: Secondary | ICD-10-CM | POA: Insufficient documentation

## 2012-05-18 HISTORY — DX: Other specified health status: Z78.9

## 2012-05-18 HISTORY — PX: KNEE ARTHROSCOPY WITH ANTERIOR CRUCIATE LIGAMENT (ACL) REPAIR: SHX5644

## 2012-05-18 LAB — POCT HEMOGLOBIN-HEMACUE: Hemoglobin: 13.8 g/dL (ref 12.0–15.0)

## 2012-05-18 SURGERY — KNEE ARTHROSCOPY WITH ANTERIOR CRUCIATE LIGAMENT (ACL) REPAIR
Anesthesia: General | Site: Knee | Laterality: Right | Wound class: Clean

## 2012-05-18 MED ORDER — CEFAZOLIN SODIUM-DEXTROSE 2-3 GM-% IV SOLR
INTRAVENOUS | Status: AC
  Administered 2012-05-18: 2 g via INTRAVENOUS
  Filled 2012-05-18: qty 50

## 2012-05-18 MED ORDER — LIDOCAINE HCL (CARDIAC) 20 MG/ML IV SOLN
INTRAVENOUS | Status: DC | PRN
  Administered 2012-05-18: 100 mg via INTRAVENOUS

## 2012-05-18 MED ORDER — BUPIVACAINE HCL (PF) 0.25 % IJ SOLN: INTRAMUSCULAR | Status: DC | PRN

## 2012-05-18 MED ORDER — MIDAZOLAM HCL 2 MG/2ML IJ SOLN
1.0000 mg | INTRAMUSCULAR | Status: DC | PRN
  Administered 2012-05-18: 2 mg via INTRAVENOUS

## 2012-05-18 MED ORDER — ONDANSETRON HCL 4 MG/2ML IJ SOLN: 4.0000 mg | Freq: Once | INTRAMUSCULAR | Status: DC | PRN

## 2012-05-18 MED ORDER — LACTATED RINGERS IV SOLN
INTRAVENOUS | Status: DC
  Administered 2012-05-18: 09:00:00 via INTRAVENOUS

## 2012-05-18 MED ORDER — HYDROMORPHONE HCL PF 1 MG/ML IJ SOLN: 0.2500 mg | INTRAMUSCULAR | Status: DC | PRN

## 2012-05-18 MED ORDER — OXYCODONE HCL 5 MG PO TABS: 5.0000 mg | ORAL_TABLET | Freq: Once | ORAL | Status: DC | PRN

## 2012-05-18 MED ORDER — MORPHINE SULFATE 10 MG/ML IJ SOLN
INTRAMUSCULAR | Status: DC | PRN
  Administered 2012-05-18 (×2): 2 mg via INTRAVENOUS

## 2012-05-18 MED ORDER — MEPERIDINE HCL 25 MG/ML IJ SOLN: 6.2500 mg | INTRAMUSCULAR | Status: DC | PRN

## 2012-05-18 MED ORDER — PROPOFOL 10 MG/ML IV BOLUS
INTRAVENOUS | Status: DC | PRN
  Administered 2012-05-18: 200 mg via INTRAVENOUS

## 2012-05-18 MED ORDER — OXYCODONE HCL 5 MG/5ML PO SOLN: 5.0000 mg | Freq: Once | ORAL | Status: DC | PRN

## 2012-05-18 MED ORDER — DEXAMETHASONE SODIUM PHOSPHATE 4 MG/ML IJ SOLN
INTRAMUSCULAR | Status: DC | PRN
  Administered 2012-05-18: 10 mg via INTRAVENOUS

## 2012-05-18 MED ORDER — OXYCODONE-ACETAMINOPHEN 7.5-325 MG PO TABS: 1.0000 | ORAL_TABLET | ORAL | Status: DC | PRN

## 2012-05-18 MED ORDER — BUPIVACAINE-EPINEPHRINE PF 0.5-1:200000 % IJ SOLN
INTRAMUSCULAR | Status: DC | PRN
  Administered 2012-05-18: 30 mL

## 2012-05-18 MED ORDER — CEFAZOLIN SODIUM-DEXTROSE 2-3 GM-% IV SOLR: 2.0000 g | INTRAVENOUS | Status: DC

## 2012-05-18 MED ORDER — MORPHINE SULFATE 4 MG/ML IJ SOLN
INTRAMUSCULAR | Status: DC | PRN
  Administered 2012-05-18: 11:00:00 via INTRA_ARTICULAR

## 2012-05-18 MED ORDER — SODIUM CHLORIDE 0.9 % IR SOLN
Status: DC | PRN
  Administered 2012-05-18: 12000 mL

## 2012-05-18 MED ORDER — ONDANSETRON HCL 4 MG/2ML IJ SOLN
INTRAMUSCULAR | Status: DC | PRN
  Administered 2012-05-18: 4 mg via INTRAVENOUS

## 2012-05-18 MED ORDER — SUCCINYLCHOLINE CHLORIDE 20 MG/ML IJ SOLN
INTRAMUSCULAR | Status: DC | PRN
  Administered 2012-05-18: 100 mg via INTRAVENOUS

## 2012-05-18 MED ORDER — FENTANYL CITRATE 0.05 MG/ML IJ SOLN
100.0000 ug | Freq: Once | INTRAMUSCULAR | Status: AC
  Administered 2012-05-18: 100 ug via INTRAVENOUS

## 2012-05-18 MED ORDER — METHOCARBAMOL 500 MG PO TABS: 500.0000 mg | ORAL_TABLET | Freq: Four times a day (QID) | ORAL | Status: DC | PRN

## 2012-05-18 SURGICAL SUPPLY — 90 items
ANCH SUT PUSHLCK 19.5X3.5 STRL (Anchor) ×1 IMPLANT
ANCHOR PUSHLOCK PEEK 3.5X19.5 (Anchor) ×1 IMPLANT
APL SKNCLS STERI-STRIP NONHPOA (GAUZE/BANDAGES/DRESSINGS) ×1
BANDAGE ELASTIC 4 VELCRO ST LF (GAUZE/BANDAGES/DRESSINGS) IMPLANT
BANDAGE ELASTIC 6 VELCRO ST LF (GAUZE/BANDAGES/DRESSINGS) IMPLANT
BANDAGE ESMARK 6X9 LF (GAUZE/BANDAGES/DRESSINGS) ×1 IMPLANT
BENZOIN TINCTURE PRP APPL 2/3 (GAUZE/BANDAGES/DRESSINGS) ×2 IMPLANT
BIOSCREW 8X25 (Screw) ×2 IMPLANT
BIOSCREW 9X25 (Screw) ×2 IMPLANT
BIT DRILL 67X1.5XWRPS STRL (BIT) ×1 IMPLANT
BIT DRILL QC 3.5X195 (BIT) IMPLANT
BIT DRL 67X1.5XWRPS STRL (BIT) ×1
BLADE 4.2CUDA (BLADE) IMPLANT
BLADE AVERAGE 25X9 (BLADE) ×2 IMPLANT
BLADE CUDA 5.5 (BLADE) IMPLANT
BLADE CUDA GRT WHITE 3.5 (BLADE) IMPLANT
BLADE CUTTER GATOR 3.5 (BLADE) ×2 IMPLANT
BLADE CUTTER MENIS 5.5 (BLADE) IMPLANT
BLADE GREAT WHITE 4.2 (BLADE) ×3 IMPLANT
BLADE SURG 15 STRL LF DISP TIS (BLADE) ×1 IMPLANT
BLADE SURG 15 STRL SS (BLADE) ×4
BNDG CMPR 9X6 STRL LF SNTH (GAUZE/BANDAGES/DRESSINGS) ×1
BNDG ESMARK 6X9 LF (GAUZE/BANDAGES/DRESSINGS) ×2
BUR EGG 3PK/BX (BURR) ×2 IMPLANT
BUR OVAL 6.0 (BURR) ×2 IMPLANT
CANISTER OMNI JUG 16 LITER (MISCELLANEOUS) ×2 IMPLANT
CANISTER SUCTION 2500CC (MISCELLANEOUS) IMPLANT
CLOTH BEACON ORANGE TIMEOUT ST (SAFETY) ×2 IMPLANT
COVER TABLE BACK 60X90 (DRAPES) ×2 IMPLANT
CUFF TOURNIQUET SINGLE 34IN LL (TOURNIQUET CUFF) ×1 IMPLANT
CUTTER MENISCUS  4.2MM (BLADE)
CUTTER MENISCUS 4.2MM (BLADE) IMPLANT
DECANTER SPIKE VIAL GLASS SM (MISCELLANEOUS) IMPLANT
DRAPE ARTHROSCOPY W/POUCH 114 (DRAPES) ×2 IMPLANT
DRAPE U-SHAPE 47X51 STRL (DRAPES) ×2 IMPLANT
DRILL BIT WIRE PASS (BIT) ×2
DURAPREP 26ML APPLICATOR (WOUND CARE) ×2 IMPLANT
ELECT MENISCUS 165MM 90D (ELECTRODE) IMPLANT
ELECT REM PT RETURN 9FT ADLT (ELECTROSURGICAL) ×2
ELECTRODE REM PT RTRN 9FT ADLT (ELECTROSURGICAL) ×1 IMPLANT
GAUZE XEROFORM 1X8 LF (GAUZE/BANDAGES/DRESSINGS) ×2 IMPLANT
GLOVE BIO SURGEON STRL SZ 6.5 (GLOVE) ×1 IMPLANT
GLOVE BIOGEL PI IND STRL 7.0 (GLOVE) IMPLANT
GLOVE BIOGEL PI IND STRL 8 (GLOVE) ×1 IMPLANT
GLOVE BIOGEL PI INDICATOR 7.0 (GLOVE) ×1
GLOVE BIOGEL PI INDICATOR 8 (GLOVE) ×1
GLOVE ORTHO TXT STRL SZ7.5 (GLOVE) ×4 IMPLANT
GOWN PREVENTION PLUS XLARGE (GOWN DISPOSABLE) ×2 IMPLANT
GOWN STRL REIN 2XL XLG LVL4 (GOWN DISPOSABLE) ×2 IMPLANT
GRAFT ACHILLES CALC BNE BLCK (Bone Implant) IMPLANT
GRAFT ACHILLES TENDON (Bone Implant) ×2 IMPLANT
IMMOBILIZER KNEE 22 UNIV (SOFTGOODS) ×2 IMPLANT
IMMOBILIZER KNEE 24 THIGH 36 (MISCELLANEOUS) ×1 IMPLANT
IMMOBILIZER KNEE 24 UNIV (MISCELLANEOUS) ×2
KNEE WRAP E Z 3 GEL PACK (MISCELLANEOUS) ×2 IMPLANT
KNIFE GRAFT ACL 10MM 5952 (MISCELLANEOUS) IMPLANT
KNIFE GRAFT ACL 9MM (MISCELLANEOUS) IMPLANT
NEEDLE MENISCAL REPAIR DBL ARM (NEEDLE) IMPLANT
NS IRRIG 1000ML POUR BTL (IV SOLUTION) ×2 IMPLANT
PACK ARTHROSCOPY DSU (CUSTOM PROCEDURE TRAY) ×2 IMPLANT
PACK BASIN DAY SURGERY FS (CUSTOM PROCEDURE TRAY) ×2 IMPLANT
PAD CAST 4YDX4 CTTN HI CHSV (CAST SUPPLIES) ×1 IMPLANT
PADDING CAST COTTON 4X4 STRL (CAST SUPPLIES) ×2
PADDING CAST COTTON 6X4 STRL (CAST SUPPLIES) ×2 IMPLANT
PASSER SUT SWANSON 36MM LOOP (INSTRUMENTS) ×2 IMPLANT
PENCIL BUTTON HOLSTER BLD 10FT (ELECTRODE) ×1 IMPLANT
SCREW BIO 8X25 (Screw) IMPLANT
SCREW BIO 9X25 (Screw) IMPLANT
SET ARTHROSCOPY TUBING (MISCELLANEOUS) ×2
SET ARTHROSCOPY TUBING LN (MISCELLANEOUS) ×1 IMPLANT
SLEEVE SCD COMPRESS KNEE MED (MISCELLANEOUS) ×1 IMPLANT
SPONGE GAUZE 4X4 12PLY (GAUZE/BANDAGES/DRESSINGS) ×4 IMPLANT
SPONGE LAP 4X18 X RAY DECT (DISPOSABLE) ×1 IMPLANT
STRIP CLOSURE SKIN 1/2X4 (GAUZE/BANDAGES/DRESSINGS) ×2 IMPLANT
SUCTION FRAZIER TIP 10 FR DISP (SUCTIONS) IMPLANT
SUT 2 FIBERLOOP 20 STRT BLUE (SUTURE) ×2
SUT ETHILON 3 0 PS 1 (SUTURE) ×2 IMPLANT
SUT FIBERWIRE #2 38 T-5 BLUE (SUTURE) ×8
SUT VIC AB 0 CT1 27 (SUTURE)
SUT VIC AB 0 CT1 27XBRD ANBCTR (SUTURE) IMPLANT
SUT VIC AB 2-0 SH 27 (SUTURE) ×2
SUT VIC AB 2-0 SH 27XBRD (SUTURE) ×1 IMPLANT
SUT VIC AB 3-0 SH 27 (SUTURE) ×2
SUT VIC AB 3-0 SH 27X BRD (SUTURE) IMPLANT
SUT VICRYL 4-0 PS2 18IN ABS (SUTURE) IMPLANT
SUTURE 2 FIBERLOOP 20 STRT BLU (SUTURE) ×1 IMPLANT
SUTURE FIBERWR #2 38 T-5 BLUE (SUTURE) ×4 IMPLANT
TOWEL OR 17X24 6PK STRL BLUE (TOWEL DISPOSABLE) ×2 IMPLANT
TOWEL OR NON WOVEN STRL DISP B (DISPOSABLE) ×2 IMPLANT
WATER STERILE IRR 1000ML POUR (IV SOLUTION) ×2 IMPLANT

## 2012-05-18 NOTE — Brief Op Note (Signed)
05/18/2012  11:49 AM  PATIENT:  Doristine Counter Piccininni  61 y.o. female  PRE-OPERATIVE DIAGNOSIS:  RIGHT KNEE SPRAIN/TEAR CRUCIATE LIGAMENT  POST-OPERATIVE DIAGNOSIS:  Right Knee Anterior Cruciate Ligament Tear  PROCEDURE:  Procedure(s) (LRB) with comments: KNEE ARTHROSCOPY WITH ANTERIOR CRUCIATE LIGAMENT (ACL) REPAIR (Right) - RIGHT KNEE ARTHROSCOPY AND ACL RECONSTRUCTION AND MICROFRACTURE, Debridement Lateral Meniscus  SURGEON:  Surgeon(s) and Role:    * Loreta Ave, MD - Primary  PHYSICIAN ASSISTANT: Zonia Kief M    ANESTHESIA:   regional and general  EBL:  Total I/O In: 1500 [I.V.:1500] Out: -   BLOOD ADMINISTERED:none   SPECIMEN:  No Specimen  DISPOSITION OF SPECIMEN:  N/A  COUNTS:  YES  TOURNIQUET:   Total Tourniquet Time Documented: Thigh (Right) - 67 minutes  PATIENT DISPOSITION:  PACU - hemodynamically stable.

## 2012-05-18 NOTE — H&P (Signed)
  H&P scanned into EPIC. Right knee ACL tear with instability. Brought to OR for allograft ACL reconstruction

## 2012-05-18 NOTE — Anesthesia Preprocedure Evaluation (Signed)
Anesthesia Evaluation  Patient identified by MRN, date of birth, ID band Patient awake    Reviewed: Allergy & Precautions, H&P , NPO status , Patient's Chart, lab work & pertinent test results  Airway Mallampati: I TM Distance: >3 FB Neck ROM: Full    Dental   Pulmonary          Cardiovascular     Neuro/Psych    GI/Hepatic   Endo/Other    Renal/GU      Musculoskeletal   Abdominal   Peds  Hematology   Anesthesia Other Findings   Reproductive/Obstetrics                           Anesthesia Physical Anesthesia Plan  ASA: II  Anesthesia Plan: General   Post-op Pain Management:    Induction: Intravenous, Rapid sequence and Cricoid pressure planned  Airway Management Planned: Oral ETT  Additional Equipment:   Intra-op Plan:   Post-operative Plan: Extubation in OR  Informed Consent: I have reviewed the patients History and Physical, chart, labs and discussed the procedure including the risks, benefits and alternatives for the proposed anesthesia with the patient or authorized representative who has indicated his/her understanding and acceptance.     Plan Discussed with: CRNA and Surgeon  Anesthesia Plan Comments:         Anesthesia Quick Evaluation  

## 2012-05-18 NOTE — Interval H&P Note (Signed)
History and Physical Interval Note:  05/18/2012 7:41 AM  Mary Bartlett  has presented today for surgery, with the diagnosis of RIGHT KNEE SPRAIN/TEAR CRUCIATE LIGAMENT  The various methods of treatment have been discussed with the patient and family. After consideration of risks, benefits and other options for treatment, the patient has consented to  Procedure(s) (LRB) with comments: KNEE ARTHROSCOPY WITH ANTERIOR CRUCIATE LIGAMENT (ACL) REPAIR (Right) - RIGHT KNEE ARTHROSCOPY AND ACL REPAIR as a surgical intervention .  The patient's history has been reviewed, patient examined, no change in status, stable for surgery.  I have reviewed the patient's chart and labs.  Questions were answered to the patient's satisfaction.     Praveen Coia F

## 2012-05-18 NOTE — Anesthesia Procedure Notes (Addendum)
Anesthesia Regional Block:  Femoral nerve block  Pre-Anesthetic Checklist: ,, timeout performed, Correct Patient, Correct Site, Correct Laterality, Correct Procedure, Correct Position, site marked, Risks and benefits discussed,  Surgical consent,  Pre-op evaluation,  At surgeon's request and post-op pain management  Laterality: Right  Prep: chloraprep       Needles:  Injection technique: Single-shot  Needle Type: Echogenic Stimulator Needle          Additional Needles:  Procedures: ultrasound guided (picture in chart) and nerve stimulator Femoral nerve block  Nerve Stimulator or Paresthesia:  Response: 0.4 mA,   Additional Responses:   Narrative:  Start time: 05/18/2012 8:36 AM End time: 05/18/2012 8:50 AM Injection made incrementally with aspirations every 5 mL.  Performed by: Personally  Anesthesiologist: Arta Bruce MD  Additional Notes: Monitors applied. Patient sedated. Sterile prep and drape,hand hygiene and sterile gloves were used. Relevant anatomy identified.Needle position confirmed.Local anesthetic injected incrementally after negative aspiration. Local anesthetic spread visualized around nerve(s). Vascular puncture avoided. No complications. Image printed for medical record.The patient tolerated the procedure well.       Femoral nerve block Procedure Name: Intubation Performed by: York Grice Pre-anesthesia Checklist: Patient identified, Timeout performed, Emergency Drugs available, Suction available and Patient being monitored Patient Re-evaluated:Patient Re-evaluated prior to inductionOxygen Delivery Method: Circle system utilized Preoxygenation: Pre-oxygenation with 100% oxygen Intubation Type: IV induction Ventilation: Mask ventilation without difficulty Grade View: Grade II Tube type: Oral Tube size: 7.0 mm Number of attempts: 1 Airway Equipment and Method: Stylet Placement Confirmation: ETT inserted through vocal cords under direct vision,   positive ETCO2 and breath sounds checked- equal and bilateral Secured at: 23 cm Tube secured with: Tape Dental Injury: Teeth and Oropharynx as per pre-operative assessment

## 2012-05-18 NOTE — Transfer of Care (Signed)
Immediate Anesthesia Transfer of Care Note  Patient: Mary Bartlett  Procedure(s) Performed: Procedure(s) (LRB) with comments: KNEE ARTHROSCOPY WITH ANTERIOR CRUCIATE LIGAMENT (ACL) REPAIR (Right) - RIGHT KNEE ARTHROSCOPY AND ACL RECONSTRUCTION AND MICROFRACTURE, Debridement Lateral Meniscus  Patient Location: PACU  Anesthesia Type:General  Level of Consciousness: awake, alert  and oriented  Airway & Oxygen Therapy: Patient Spontanous Breathing and Patient connected to face mask oxygen  Post-op Assessment: Report given to PACU RN and Post -op Vital signs reviewed and stable  Post vital signs: Reviewed and stable  Complications: No apparent anesthesia complications

## 2012-05-18 NOTE — Progress Notes (Signed)
Assisted Dr. Ossey with right, ultrasound guided, femoral block. Side rails up, monitors on throughout procedure. See vital signs in flow sheet. Tolerated Procedure well. 

## 2012-05-18 NOTE — Anesthesia Postprocedure Evaluation (Signed)
Anesthesia Post Note  Patient: Mary Bartlett  Procedure(s) Performed: Procedure(s) (LRB): KNEE ARTHROSCOPY WITH ANTERIOR CRUCIATE LIGAMENT (ACL) REPAIR (Right)  Anesthesia type: general  Patient location: PACU  Post pain: Pain level controlled  Post assessment: Patient's Cardiovascular Status Stable  Last Vitals:  Filed Vitals:   05/18/12 1215  BP: 136/71  Pulse: 62  Temp:   Resp: 21    Post vital signs: Reviewed and stable  Level of consciousness: sedated  Complications: No apparent anesthesia complications

## 2012-05-19 ENCOUNTER — Encounter (HOSPITAL_BASED_OUTPATIENT_CLINIC_OR_DEPARTMENT_OTHER): Payer: Self-pay | Admitting: Orthopedic Surgery

## 2012-05-22 NOTE — Op Note (Signed)
NAMESELEENA, REIMERS NO.:  [id number]  MEDICAL RECORD NO.:  [id number]  LOCATION:                               FACILITY:  [facility]  PHYSICIAN:  Loreta Ave, M.D.      DATE OF BIRTH:  DATE OF PROCEDURE:  05/18/2012 DATE OF DISCHARGE:  05/18/2012                              OPERATIVE REPORT   PREOPERATIVE DIAGNOSIS:  Right knee acute on chronic anterior cruciate ligament tear with anterolateral rotary instability.  POSTOPERATIVE DIAGNOSIS:  Right knee acute on chronic anterior cruciate ligament tear with anterolateral rotary instability with essentially complete tear of remaining ACL from previous tear in the past.  Flap tear posterolateral meniscus.  Radial tearing middle 3rd.  Focal grade 4 osteochondral lesion, weightbearing dome, lateral femoral condyle 45 mm, but full-thickness.  PROCEDURE:  Right knee exam under anesthesia, arthroscopy. Chondroplasty, microfracture lateral femoral condyle.  Debridement lateral meniscus.  Arthroscopic and endoscopic ACL reconstruction, Achilles allograft bone in the femoral tunnel.  Bioabsorbable screw fixation above and below with a Push-Loc distally.  SURGEON:  Loreta Ave, M.D.  ASSISTANT:  Genene Churn. Barry Dienes, [place], present throughout the entire case and necessary for timely completion of procedure.  ANESTHESIA:  General.  BLOOD LOSS:  Minimal.  SPECIMENS:  None.  CULTURES:  None.  COMPLICATIONS:  None.  DRESSINGS:  Soft compressive knee immobilizer.  TOURNIQUET TIME:  1 hour.  PROCEDURE:  The patient was brought to the operating room, placed on the operating table in a supine position.  After adequate anesthesia had been obtained, right knee examined.  Full motion.  Positive Lachman, drawer, pivot shift.  Other ligaments stable.  Tourniquet applied. Prepped and draped in usual sterile fashion.  Exsanguinated with elevation, Esmarch.  Tourniquet inflated to 350 mmHg.  Two portals, one each medial and  lateral parapatellar.  Arthroscope introduced, knee distended and inspected.  Some mild grade 2 changes on the patella. Good tracking.  More than half the ACL torn into a cicatrix at the base. Remaining fibers stretched, attenuated nonfunctional.  All debrided. Notch fairly generous opened a little bit with notchplasty.  PCL intact. Medial meniscus, medial compartment looked good.  Some mild grade 2 changes.  Laterally flap tear of the posterior third of meniscus with some radial tear in the middle, both debrided retaining fair amount of meniscus behind.  Tibial plateau looked good, but anterior in the middle of the condyle a full-thickness 4 mm full-thickness chondral defect. Debrided to a stable surface.  Treated with microfracture.  Achilles graft prepared for 10 mm tunnels.  Small incision next to tibial tubercle.  Guidewire driven, overdrilled with a reamer.  Debris cleared with a shaver.  Femoral guide inserted across tibial tunnel notch on the back cortex of femur.  The tunnel in the femur created for appropriate depth for the graft.  Debris cleared throughout.  Tunnels assessed, found to be in good position.  Moderately osteopenic throughout.  Tubing passer inserted across both tunnels out through a stab wound in the anterolateral thigh.  Nitinol wire brought to the medial portal out through the femoral tunnel.  Graft attached to tubing passer and pulled in across the knee  seating it well on both tunnels.  Fixed over Nitinol wire with bioabsorbable screws above and below.  At the distal end, suture brought out and firmly fixed in the tibia with a predrilled Push- Loc.  At completion, good motion and stability.  Good clearance of the graft and brought through full motion.  All wounds irrigated.  Portals closed with nylon.  Incision closed in subcutaneous with subcuticular Vicryl.  Sterile compressive dressing applied.  Tourniquet deflated and removed.  Knee immobilizer applied.   Anesthesia reversed.  Brought to the recovery room.  Tolerated surgery well.  No complications.     Loreta Ave, M.D.     DFM/MEDQ  D:  05/19/2012  T:  05/20/2012  Job:  [id number]

## 2012-06-06 ENCOUNTER — Other Ambulatory Visit: Payer: Self-pay | Admitting: *Deleted

## 2012-06-06 ENCOUNTER — Ambulatory Visit (INDEPENDENT_AMBULATORY_CARE_PROVIDER_SITE_OTHER): Payer: BC Managed Care – PPO | Admitting: Surgery

## 2012-06-06 ENCOUNTER — Encounter (INDEPENDENT_AMBULATORY_CARE_PROVIDER_SITE_OTHER): Payer: BC Managed Care – PPO | Admitting: *Deleted

## 2012-06-06 ENCOUNTER — Encounter (INDEPENDENT_AMBULATORY_CARE_PROVIDER_SITE_OTHER): Payer: Self-pay | Admitting: Surgery

## 2012-06-06 VITALS — BP 118/80 | HR 60 | Resp 16 | Ht 67.0 in | Wt 141.0 lb

## 2012-06-06 DIAGNOSIS — I82409 Acute embolism and thrombosis of unspecified deep veins of unspecified lower extremity: Secondary | ICD-10-CM

## 2012-06-06 DIAGNOSIS — Z853 Personal history of malignant neoplasm of breast: Secondary | ICD-10-CM

## 2012-06-06 NOTE — Progress Notes (Signed)
NAME: [name] Defina       DOB: 23-Aug-1950           DATE: 06/06/2012       MRN: [id number]   Mary Bartlett is a 61 y.o.Marland Kitchenfemale who presents for routine followup of her Left breast cancer -DCIS -  diagnosed in 2012 and treated with partial central mastectomy and radiation. No chemo, no hormones. She has no problems or concerns on either side. She is thinking about a reconstruction and plans to see Dr Odis Luster  [employer]: She has had no significant changes since the last visit here.  ROS: There have been no significant changes since the last visit here  EXAM: General: The patient is alert, oriented, generally healty appearing, NAD. Mood and affect are normal.  Breasts:  Right breast is normal. Left a little smaller, slightly tender, no radiation changes visible, no masses  Lymphatics: She has no axillary or supraclavicular adenopathy on either side.  Extremities: Full ROM of the surgical side with no lymphedema noted.  Data Reviewed: Mammograms in May, 2013  Impression: Doing well, with no evidence of recurrent cancer or new cancer  Plan: RTC 6 Months after mammogram. Told her she should have a mammogram before any reconstruction

## 2012-06-06 NOTE — Patient Instructions (Signed)
See me again after your next mammogram 

## 2012-10-11 ENCOUNTER — Other Ambulatory Visit: Payer: Self-pay | Admitting: Gynecology

## 2012-10-11 DIAGNOSIS — Z853 Personal history of malignant neoplasm of breast: Secondary | ICD-10-CM

## 2012-10-11 DIAGNOSIS — Z9889 Other specified postprocedural states: Secondary | ICD-10-CM

## 2012-10-12 ENCOUNTER — Encounter (INDEPENDENT_AMBULATORY_CARE_PROVIDER_SITE_OTHER): Payer: Self-pay | Admitting: Surgery

## 2012-10-12 ENCOUNTER — Telehealth (INDEPENDENT_AMBULATORY_CARE_PROVIDER_SITE_OTHER): Payer: Self-pay | Admitting: General Surgery

## 2012-10-12 NOTE — Telephone Encounter (Signed)
Message copied by Liliana Cline on Thu Oct 12, 2012  2:39 PM ------      Message from: Currie Paris      Created: Thu Oct 12, 2012  1:54 PM       Should be fine for Dr Chevis Pretty to see and I can see if any issues      ----- Message -----         From: Liliana Cline, CMA         Sent: 10/12/2012   1:18 PM           To: Currie Paris, MD            Please advise.      Lesly Rubenstein      ----- Message -----         From: Zacarias Pontes         Sent: 10/12/2012  12:31 PM           To: Liliana Cline, CMA            Pt due to see Dr CS in  May for 6 mo br reck..she is seeing Dr Chevis Pretty in May also and wants to know is it necessary for her to come here to             ------

## 2012-10-12 NOTE — Telephone Encounter (Signed)
Patient made aware she can follow up with Dr Chevis Pretty and call us with any issues.

## 2012-11-20 ENCOUNTER — Ambulatory Visit
Admission: RE | Admit: 2012-11-20 | Discharge: 2012-11-20 | Disposition: A | Discharge status: Home / Self Care | Payer: BC Managed Care – PPO | Source: Ambulatory Visit | Attending: Gynecology | Admitting: Gynecology

## 2012-11-20 DIAGNOSIS — Z853 Personal history of malignant neoplasm of breast: Secondary | ICD-10-CM

## 2012-11-20 DIAGNOSIS — Z9889 Other specified postprocedural states: Secondary | ICD-10-CM

## 2013-10-22 ENCOUNTER — Other Ambulatory Visit: Payer: Self-pay | Admitting: Gynecology

## 2013-10-22 DIAGNOSIS — Z853 Personal history of malignant neoplasm of breast: Secondary | ICD-10-CM

## 2013-10-22 DIAGNOSIS — Z9889 Other specified postprocedural states: Secondary | ICD-10-CM

## 2013-11-26 ENCOUNTER — Encounter (INDEPENDENT_AMBULATORY_CARE_PROVIDER_SITE_OTHER): Payer: Self-pay

## 2013-11-26 ENCOUNTER — Ambulatory Visit
Admission: RE | Admit: 2013-11-26 | Discharge: 2013-11-26 | Disposition: A | Discharge status: Home / Self Care | Payer: BC Managed Care – PPO | Source: Ambulatory Visit | Attending: Gynecology | Admitting: Gynecology

## 2013-11-26 DIAGNOSIS — Z9889 Other specified postprocedural states: Secondary | ICD-10-CM

## 2013-11-26 DIAGNOSIS — Z853 Personal history of malignant neoplasm of breast: Secondary | ICD-10-CM

## 2014-11-18 ENCOUNTER — Other Ambulatory Visit: Payer: Self-pay

## 2014-11-18 DIAGNOSIS — Z1231 Encounter for screening mammogram for malignant neoplasm of breast: Secondary | ICD-10-CM

## 2014-11-19 DIAGNOSIS — H00013 Hordeolum externum right eye, unspecified eyelid: Secondary | ICD-10-CM | POA: Diagnosis not present

## 2014-12-09 ENCOUNTER — Other Ambulatory Visit: Payer: Self-pay

## 2014-12-09 ENCOUNTER — Ambulatory Visit
Admission: RE | Admit: 2014-12-09 | Discharge: 2014-12-09 | Disposition: A | Discharge status: Home / Self Care | Payer: Medicare Other | Source: Ambulatory Visit

## 2014-12-09 ENCOUNTER — Other Ambulatory Visit: Payer: Self-pay | Admitting: Gynecology

## 2014-12-09 ENCOUNTER — Ambulatory Visit
Admission: RE | Admit: 2014-12-09 | Discharge: 2014-12-09 | Disposition: A | Discharge status: Home / Self Care | Payer: Medicare Other | Source: Ambulatory Visit | Attending: Gynecology | Admitting: Gynecology

## 2014-12-09 ENCOUNTER — Other Ambulatory Visit: Payer: Self-pay | Admitting: Obstetrics and Gynecology

## 2014-12-09 DIAGNOSIS — Z9889 Other specified postprocedural states: Secondary | ICD-10-CM

## 2014-12-09 DIAGNOSIS — Z01419 Encounter for gynecological examination (general) (routine) without abnormal findings: Secondary | ICD-10-CM | POA: Diagnosis not present

## 2014-12-09 DIAGNOSIS — N6001 Solitary cyst of right breast: Secondary | ICD-10-CM | POA: Diagnosis not present

## 2014-12-09 DIAGNOSIS — N63 Unspecified lump in breast: Secondary | ICD-10-CM | POA: Diagnosis not present

## 2014-12-09 DIAGNOSIS — Z6821 Body mass index (BMI) 21.0-21.9, adult: Secondary | ICD-10-CM | POA: Diagnosis not present

## 2014-12-09 DIAGNOSIS — Z1231 Encounter for screening mammogram for malignant neoplasm of breast: Secondary | ICD-10-CM

## 2014-12-09 DIAGNOSIS — Z853 Personal history of malignant neoplasm of breast: Secondary | ICD-10-CM | POA: Diagnosis not present

## 2015-01-13 DIAGNOSIS — Z9889 Other specified postprocedural states: Secondary | ICD-10-CM | POA: Diagnosis not present

## 2015-01-13 DIAGNOSIS — M7062 Trochanteric bursitis, left hip: Secondary | ICD-10-CM | POA: Diagnosis not present

## 2015-01-13 DIAGNOSIS — M25552 Pain in left hip: Secondary | ICD-10-CM | POA: Diagnosis not present

## 2015-02-10 DIAGNOSIS — M7062 Trochanteric bursitis, left hip: Secondary | ICD-10-CM | POA: Diagnosis not present

## 2015-02-10 DIAGNOSIS — M545 Low back pain: Secondary | ICD-10-CM | POA: Diagnosis not present

## 2015-03-10 DIAGNOSIS — L565 Disseminated superficial actinic porokeratosis (DSAP): Secondary | ICD-10-CM | POA: Diagnosis not present

## 2015-03-10 DIAGNOSIS — L821 Other seborrheic keratosis: Secondary | ICD-10-CM | POA: Diagnosis not present

## 2015-03-10 DIAGNOSIS — L820 Inflamed seborrheic keratosis: Secondary | ICD-10-CM | POA: Diagnosis not present

## 2015-03-10 DIAGNOSIS — L570 Actinic keratosis: Secondary | ICD-10-CM | POA: Diagnosis not present

## 2015-04-17 DIAGNOSIS — N9089 Other specified noninflammatory disorders of vulva and perineum: Secondary | ICD-10-CM | POA: Diagnosis not present

## 2015-05-12 DIAGNOSIS — M545 Low back pain: Secondary | ICD-10-CM | POA: Diagnosis not present

## 2015-05-12 DIAGNOSIS — M7062 Trochanteric bursitis, left hip: Secondary | ICD-10-CM | POA: Diagnosis not present

## 2015-05-13 DIAGNOSIS — M25552 Pain in left hip: Secondary | ICD-10-CM | POA: Diagnosis not present

## 2015-05-13 DIAGNOSIS — M545 Low back pain: Secondary | ICD-10-CM | POA: Diagnosis not present

## 2015-05-14 DIAGNOSIS — I7789 Other specified disorders of arteries and arterioles: Secondary | ICD-10-CM | POA: Diagnosis not present

## 2015-05-22 DIAGNOSIS — M7062 Trochanteric bursitis, left hip: Secondary | ICD-10-CM | POA: Diagnosis not present

## 2015-05-22 DIAGNOSIS — Z0001 Encounter for general adult medical examination with abnormal findings: Secondary | ICD-10-CM | POA: Diagnosis not present

## 2015-05-22 DIAGNOSIS — G4700 Insomnia, unspecified: Secondary | ICD-10-CM | POA: Diagnosis not present

## 2015-05-22 DIAGNOSIS — Z23 Encounter for immunization: Secondary | ICD-10-CM | POA: Diagnosis not present

## 2015-05-22 DIAGNOSIS — M545 Low back pain: Secondary | ICD-10-CM | POA: Diagnosis not present

## 2015-05-22 DIAGNOSIS — G43019 Migraine without aura, intractable, without status migrainosus: Secondary | ICD-10-CM | POA: Diagnosis not present

## 2015-05-22 DIAGNOSIS — M25552 Pain in left hip: Secondary | ICD-10-CM | POA: Diagnosis not present

## 2015-05-22 DIAGNOSIS — M791 Myalgia: Secondary | ICD-10-CM | POA: Diagnosis not present

## 2015-05-22 DIAGNOSIS — M7060 Trochanteric bursitis, unspecified hip: Secondary | ICD-10-CM | POA: Diagnosis not present

## 2015-05-22 DIAGNOSIS — Z79899 Other long term (current) drug therapy: Secondary | ICD-10-CM | POA: Diagnosis not present

## 2015-05-22 DIAGNOSIS — K219 Gastro-esophageal reflux disease without esophagitis: Secondary | ICD-10-CM | POA: Diagnosis not present

## 2015-05-22 DIAGNOSIS — G9001 Carotid sinus syncope: Secondary | ICD-10-CM | POA: Diagnosis not present

## 2015-05-22 DIAGNOSIS — B009 Herpesviral infection, unspecified: Secondary | ICD-10-CM | POA: Diagnosis not present

## 2015-05-22 DIAGNOSIS — M5432 Sciatica, left side: Secondary | ICD-10-CM | POA: Diagnosis not present

## 2015-05-28 DIAGNOSIS — M25552 Pain in left hip: Secondary | ICD-10-CM | POA: Diagnosis not present

## 2015-05-28 DIAGNOSIS — M545 Low back pain: Secondary | ICD-10-CM | POA: Diagnosis not present

## 2015-05-28 DIAGNOSIS — I7789 Other specified disorders of arteries and arterioles: Secondary | ICD-10-CM | POA: Diagnosis not present

## 2015-05-29 DIAGNOSIS — K5730 Diverticulosis of large intestine without perforation or abscess without bleeding: Secondary | ICD-10-CM | POA: Diagnosis not present

## 2015-05-29 DIAGNOSIS — K641 Second degree hemorrhoids: Secondary | ICD-10-CM | POA: Diagnosis not present

## 2015-05-29 DIAGNOSIS — Z1211 Encounter for screening for malignant neoplasm of colon: Secondary | ICD-10-CM | POA: Diagnosis not present

## 2015-05-30 DIAGNOSIS — M25552 Pain in left hip: Secondary | ICD-10-CM | POA: Diagnosis not present

## 2015-05-30 DIAGNOSIS — M545 Low back pain: Secondary | ICD-10-CM | POA: Diagnosis not present

## 2015-06-03 DIAGNOSIS — M25552 Pain in left hip: Secondary | ICD-10-CM | POA: Diagnosis not present

## 2015-06-03 DIAGNOSIS — M545 Low back pain: Secondary | ICD-10-CM | POA: Diagnosis not present

## 2015-06-04 ENCOUNTER — Other Ambulatory Visit: Payer: Self-pay | Admitting: Otolaryngology

## 2015-06-04 DIAGNOSIS — I779 Disorder of arteries and arterioles, unspecified: Secondary | ICD-10-CM

## 2015-06-18 DIAGNOSIS — M545 Low back pain: Secondary | ICD-10-CM | POA: Diagnosis not present

## 2015-06-18 DIAGNOSIS — M25552 Pain in left hip: Secondary | ICD-10-CM | POA: Diagnosis not present

## 2015-06-19 ENCOUNTER — Ambulatory Visit
Admission: RE | Admit: 2015-06-19 | Discharge: 2015-06-19 | Disposition: A | Discharge status: Home / Self Care | Payer: Medicare Other | Source: Ambulatory Visit | Attending: Otolaryngology | Admitting: Otolaryngology

## 2015-06-19 DIAGNOSIS — I779 Disorder of arteries and arterioles, unspecified: Secondary | ICD-10-CM

## 2015-06-19 DIAGNOSIS — R221 Localized swelling, mass and lump, neck: Secondary | ICD-10-CM | POA: Diagnosis not present

## 2015-06-19 MED ORDER — GADOBENATE DIMEGLUMINE 529 MG/ML IV SOLN: 12.0000 mL | Freq: Once | INTRAVENOUS | Status: DC | PRN

## 2015-07-23 DIAGNOSIS — K641 Second degree hemorrhoids: Secondary | ICD-10-CM | POA: Diagnosis not present

## 2015-08-06 DIAGNOSIS — K641 Second degree hemorrhoids: Secondary | ICD-10-CM | POA: Diagnosis not present

## 2015-08-18 DIAGNOSIS — L814 Other melanin hyperpigmentation: Secondary | ICD-10-CM | POA: Diagnosis not present

## 2015-08-18 DIAGNOSIS — D1801 Hemangioma of skin and subcutaneous tissue: Secondary | ICD-10-CM | POA: Diagnosis not present

## 2015-08-18 DIAGNOSIS — L821 Other seborrheic keratosis: Secondary | ICD-10-CM | POA: Diagnosis not present

## 2015-08-18 DIAGNOSIS — L820 Inflamed seborrheic keratosis: Secondary | ICD-10-CM | POA: Diagnosis not present

## 2015-08-20 DIAGNOSIS — K641 Second degree hemorrhoids: Secondary | ICD-10-CM | POA: Diagnosis not present

## 2015-09-17 ENCOUNTER — Other Ambulatory Visit: Payer: Self-pay | Admitting: Obstetrics and Gynecology

## 2015-09-17 DIAGNOSIS — Z9889 Other specified postprocedural states: Secondary | ICD-10-CM

## 2015-09-17 DIAGNOSIS — Z853 Personal history of malignant neoplasm of breast: Secondary | ICD-10-CM

## 2015-09-23 DIAGNOSIS — H25042 Posterior subcapsular polar age-related cataract, left eye: Secondary | ICD-10-CM | POA: Diagnosis not present

## 2015-12-10 ENCOUNTER — Other Ambulatory Visit: Payer: Self-pay | Admitting: Obstetrics and Gynecology

## 2015-12-10 ENCOUNTER — Ambulatory Visit
Admission: RE | Admit: 2015-12-10 | Discharge: 2015-12-10 | Disposition: A | Discharge status: Home / Self Care | Payer: Medicare Other | Source: Ambulatory Visit | Attending: Obstetrics and Gynecology | Admitting: Obstetrics and Gynecology

## 2015-12-10 DIAGNOSIS — N644 Mastodynia: Secondary | ICD-10-CM

## 2015-12-10 DIAGNOSIS — Z853 Personal history of malignant neoplasm of breast: Secondary | ICD-10-CM

## 2015-12-10 DIAGNOSIS — Z6821 Body mass index (BMI) 21.0-21.9, adult: Secondary | ICD-10-CM | POA: Diagnosis not present

## 2015-12-10 DIAGNOSIS — Z01419 Encounter for gynecological examination (general) (routine) without abnormal findings: Secondary | ICD-10-CM | POA: Diagnosis not present

## 2015-12-27 DIAGNOSIS — R21 Rash and other nonspecific skin eruption: Secondary | ICD-10-CM | POA: Diagnosis not present

## 2016-01-01 DIAGNOSIS — R87616 Satisfactory cervical smear but lacking transformation zone: Secondary | ICD-10-CM | POA: Diagnosis not present

## 2016-03-08 DIAGNOSIS — D2261 Melanocytic nevi of right upper limb, including shoulder: Secondary | ICD-10-CM | POA: Diagnosis not present

## 2016-03-08 DIAGNOSIS — L570 Actinic keratosis: Secondary | ICD-10-CM | POA: Diagnosis not present

## 2016-03-08 DIAGNOSIS — D1801 Hemangioma of skin and subcutaneous tissue: Secondary | ICD-10-CM | POA: Diagnosis not present

## 2016-03-08 DIAGNOSIS — L820 Inflamed seborrheic keratosis: Secondary | ICD-10-CM | POA: Diagnosis not present

## 2016-03-08 DIAGNOSIS — L821 Other seborrheic keratosis: Secondary | ICD-10-CM | POA: Diagnosis not present

## 2016-03-08 DIAGNOSIS — D225 Melanocytic nevi of trunk: Secondary | ICD-10-CM | POA: Diagnosis not present

## 2016-03-08 DIAGNOSIS — D2262 Melanocytic nevi of left upper limb, including shoulder: Secondary | ICD-10-CM | POA: Diagnosis not present

## 2016-03-08 DIAGNOSIS — L814 Other melanin hyperpigmentation: Secondary | ICD-10-CM | POA: Diagnosis not present

## 2016-03-08 DIAGNOSIS — L720 Epidermal cyst: Secondary | ICD-10-CM | POA: Diagnosis not present

## 2016-04-22 DIAGNOSIS — H0012 Chalazion right lower eyelid: Secondary | ICD-10-CM | POA: Diagnosis not present

## 2016-06-02 DIAGNOSIS — H0015 Chalazion left lower eyelid: Secondary | ICD-10-CM | POA: Diagnosis not present

## 2016-06-15 DIAGNOSIS — Z853 Personal history of malignant neoplasm of breast: Secondary | ICD-10-CM | POA: Diagnosis not present

## 2016-06-15 DIAGNOSIS — Z9012 Acquired absence of left breast and nipple: Secondary | ICD-10-CM | POA: Diagnosis not present

## 2016-06-15 DIAGNOSIS — D485 Neoplasm of uncertain behavior of skin: Secondary | ICD-10-CM | POA: Diagnosis not present

## 2016-06-17 DIAGNOSIS — Z23 Encounter for immunization: Secondary | ICD-10-CM | POA: Diagnosis not present

## 2016-06-17 DIAGNOSIS — Z608 Other problems related to social environment: Secondary | ICD-10-CM | POA: Diagnosis not present

## 2016-06-17 DIAGNOSIS — Z0000 Encounter for general adult medical examination without abnormal findings: Secondary | ICD-10-CM | POA: Diagnosis not present

## 2016-06-17 DIAGNOSIS — B009 Herpesviral infection, unspecified: Secondary | ICD-10-CM | POA: Diagnosis not present

## 2016-06-17 DIAGNOSIS — G4700 Insomnia, unspecified: Secondary | ICD-10-CM | POA: Diagnosis not present

## 2016-06-17 DIAGNOSIS — Z79899 Other long term (current) drug therapy: Secondary | ICD-10-CM | POA: Diagnosis not present

## 2016-06-17 DIAGNOSIS — C50919 Malignant neoplasm of unspecified site of unspecified female breast: Secondary | ICD-10-CM | POA: Diagnosis not present

## 2016-06-17 DIAGNOSIS — Z1389 Encounter for screening for other disorder: Secondary | ICD-10-CM | POA: Diagnosis not present

## 2016-06-17 DIAGNOSIS — K219 Gastro-esophageal reflux disease without esophagitis: Secondary | ICD-10-CM | POA: Diagnosis not present

## 2016-06-17 DIAGNOSIS — G43019 Migraine without aura, intractable, without status migrainosus: Secondary | ICD-10-CM | POA: Diagnosis not present

## 2016-08-31 DIAGNOSIS — L723 Sebaceous cyst: Secondary | ICD-10-CM | POA: Diagnosis not present

## 2016-09-02 DIAGNOSIS — L814 Other melanin hyperpigmentation: Secondary | ICD-10-CM | POA: Diagnosis not present

## 2016-09-02 DIAGNOSIS — L821 Other seborrheic keratosis: Secondary | ICD-10-CM | POA: Diagnosis not present

## 2016-09-02 DIAGNOSIS — D1801 Hemangioma of skin and subcutaneous tissue: Secondary | ICD-10-CM | POA: Diagnosis not present

## 2016-09-02 DIAGNOSIS — L570 Actinic keratosis: Secondary | ICD-10-CM | POA: Diagnosis not present

## 2016-09-07 DIAGNOSIS — N9089 Other specified noninflammatory disorders of vulva and perineum: Secondary | ICD-10-CM | POA: Diagnosis not present

## 2016-09-29 DIAGNOSIS — K219 Gastro-esophageal reflux disease without esophagitis: Secondary | ICD-10-CM | POA: Diagnosis not present

## 2016-10-06 DIAGNOSIS — H1013 Acute atopic conjunctivitis, bilateral: Secondary | ICD-10-CM | POA: Diagnosis not present

## 2016-11-01 ENCOUNTER — Other Ambulatory Visit: Payer: Self-pay | Admitting: Obstetrics and Gynecology

## 2016-11-01 DIAGNOSIS — N6320 Unspecified lump in the left breast, unspecified quadrant: Secondary | ICD-10-CM

## 2016-11-01 DIAGNOSIS — Z1231 Encounter for screening mammogram for malignant neoplasm of breast: Secondary | ICD-10-CM

## 2016-11-18 DIAGNOSIS — L820 Inflamed seborrheic keratosis: Secondary | ICD-10-CM | POA: Diagnosis not present

## 2016-11-18 DIAGNOSIS — L821 Other seborrheic keratosis: Secondary | ICD-10-CM | POA: Diagnosis not present

## 2016-11-18 DIAGNOSIS — L814 Other melanin hyperpigmentation: Secondary | ICD-10-CM | POA: Diagnosis not present

## 2016-11-18 DIAGNOSIS — D1801 Hemangioma of skin and subcutaneous tissue: Secondary | ICD-10-CM | POA: Diagnosis not present

## 2016-11-18 DIAGNOSIS — L570 Actinic keratosis: Secondary | ICD-10-CM | POA: Diagnosis not present

## 2016-12-09 ENCOUNTER — Other Ambulatory Visit: Payer: Self-pay | Admitting: Obstetrics and Gynecology

## 2016-12-09 DIAGNOSIS — N6320 Unspecified lump in the left breast, unspecified quadrant: Secondary | ICD-10-CM

## 2016-12-14 ENCOUNTER — Other Ambulatory Visit: Payer: Self-pay | Admitting: Obstetrics and Gynecology

## 2016-12-14 ENCOUNTER — Ambulatory Visit
Admission: RE | Admit: 2016-12-14 | Discharge: 2016-12-14 | Disposition: A | Discharge status: Home / Self Care | Payer: Medicare Other | Source: Ambulatory Visit | Attending: Obstetrics and Gynecology | Admitting: Obstetrics and Gynecology

## 2016-12-14 DIAGNOSIS — N958 Other specified menopausal and perimenopausal disorders: Secondary | ICD-10-CM | POA: Diagnosis not present

## 2016-12-14 DIAGNOSIS — R928 Other abnormal and inconclusive findings on diagnostic imaging of breast: Secondary | ICD-10-CM | POA: Diagnosis not present

## 2016-12-14 DIAGNOSIS — Z6821 Body mass index (BMI) 21.0-21.9, adult: Secondary | ICD-10-CM | POA: Diagnosis not present

## 2016-12-14 DIAGNOSIS — M8588 Other specified disorders of bone density and structure, other site: Secondary | ICD-10-CM | POA: Diagnosis not present

## 2016-12-14 DIAGNOSIS — N6320 Unspecified lump in the left breast, unspecified quadrant: Secondary | ICD-10-CM

## 2016-12-14 DIAGNOSIS — L905 Scar conditions and fibrosis of skin: Secondary | ICD-10-CM

## 2016-12-14 DIAGNOSIS — N6489 Other specified disorders of breast: Secondary | ICD-10-CM | POA: Diagnosis not present

## 2016-12-14 DIAGNOSIS — Z01419 Encounter for gynecological examination (general) (routine) without abnormal findings: Secondary | ICD-10-CM | POA: Diagnosis not present

## 2016-12-14 HISTORY — DX: Personal history of irradiation: Z92.3

## 2017-01-05 DIAGNOSIS — M859 Disorder of bone density and structure, unspecified: Secondary | ICD-10-CM | POA: Diagnosis not present

## 2017-01-13 DIAGNOSIS — D239 Other benign neoplasm of skin, unspecified: Secondary | ICD-10-CM | POA: Diagnosis not present

## 2017-01-13 DIAGNOSIS — L570 Actinic keratosis: Secondary | ICD-10-CM | POA: Diagnosis not present

## 2017-01-13 DIAGNOSIS — L247 Irritant contact dermatitis due to plants, except food: Secondary | ICD-10-CM | POA: Diagnosis not present

## 2017-03-07 DIAGNOSIS — L821 Other seborrheic keratosis: Secondary | ICD-10-CM | POA: Diagnosis not present

## 2017-05-19 DIAGNOSIS — L570 Actinic keratosis: Secondary | ICD-10-CM | POA: Diagnosis not present

## 2017-05-19 DIAGNOSIS — L820 Inflamed seborrheic keratosis: Secondary | ICD-10-CM | POA: Diagnosis not present

## 2017-05-19 DIAGNOSIS — L821 Other seborrheic keratosis: Secondary | ICD-10-CM | POA: Diagnosis not present

## 2017-05-24 DIAGNOSIS — L0231 Cutaneous abscess of buttock: Secondary | ICD-10-CM | POA: Diagnosis not present

## 2017-06-14 DIAGNOSIS — L244 Irritant contact dermatitis due to drugs in contact with skin: Secondary | ICD-10-CM | POA: Diagnosis not present

## 2017-06-23 DIAGNOSIS — Z23 Encounter for immunization: Secondary | ICD-10-CM | POA: Diagnosis not present

## 2017-06-23 DIAGNOSIS — G4700 Insomnia, unspecified: Secondary | ICD-10-CM | POA: Diagnosis not present

## 2017-06-23 DIAGNOSIS — Z0000 Encounter for general adult medical examination without abnormal findings: Secondary | ICD-10-CM | POA: Diagnosis not present

## 2017-06-23 DIAGNOSIS — K219 Gastro-esophageal reflux disease without esophagitis: Secondary | ICD-10-CM | POA: Diagnosis not present

## 2017-06-23 DIAGNOSIS — Z608 Other problems related to social environment: Secondary | ICD-10-CM | POA: Diagnosis not present

## 2017-06-23 DIAGNOSIS — B009 Herpesviral infection, unspecified: Secondary | ICD-10-CM | POA: Diagnosis not present

## 2017-06-23 DIAGNOSIS — Z853 Personal history of malignant neoplasm of breast: Secondary | ICD-10-CM | POA: Diagnosis not present

## 2017-06-23 DIAGNOSIS — G43019 Migraine without aura, intractable, without status migrainosus: Secondary | ICD-10-CM | POA: Diagnosis not present

## 2017-06-23 DIAGNOSIS — Z1389 Encounter for screening for other disorder: Secondary | ICD-10-CM | POA: Diagnosis not present

## 2017-06-23 DIAGNOSIS — R739 Hyperglycemia, unspecified: Secondary | ICD-10-CM | POA: Diagnosis not present

## 2017-11-02 DIAGNOSIS — H2513 Age-related nuclear cataract, bilateral: Secondary | ICD-10-CM | POA: Diagnosis not present

## 2017-11-21 ENCOUNTER — Ambulatory Visit: Payer: Self-pay

## 2017-11-21 ENCOUNTER — Encounter: Payer: Self-pay | Admitting: Sports Medicine

## 2017-11-21 ENCOUNTER — Ambulatory Visit (INDEPENDENT_AMBULATORY_CARE_PROVIDER_SITE_OTHER): Payer: Medicare Other | Admitting: Sports Medicine

## 2017-11-21 VITALS — BP 118/72 | Ht 67.0 in | Wt 136.0 lb

## 2017-11-21 DIAGNOSIS — M7661 Achilles tendinitis, right leg: Secondary | ICD-10-CM

## 2017-11-21 NOTE — Progress Notes (Signed)
   HPI  CC: Right Heel Pain  Right heel/achilles pain - Patient endorses soreness and swelling of her right heel over the past 2 months. She was in her usual state of health until playing tennis more intensely than usual 2 months ago and has had soreness in this area ever since. She reports pain is worse when she becomes active after sitting. She notices swelling particularly after walking hills. She stays active with walking, golf and tennis. She denies any surgeries in this area in the past, though has had R ACL repair.   Of note the patient does have a history of breast cancer (DCIS) but she denies ever having been treated with chemotherapeutic agents. Furthermore she denies history of antibiotics including ciprofloxacin or levofloxacin in the past 6 months.  Smoking Family Hx, CC reviewed.   ROS: Per HPI; in addition no fever, no rash, no additional weakness, no additional numbness, no additional paresthesias, and no additional falls/injury.   Objective: BP 118/72   Ht 5\' 7"  (1.702 m)   Wt 136 lb (61.7 kg)   BMI 21.30 kg/m  Gen: NAD, well groomed, a/o x3, normal affect.  CV: Well-perfused. Warm.  Resp: Non-labored.  Neuro: Sensation intact throughout. [place] coordination deficits.  Gait: Nonpathologic posture, unremarkable stride without signs of limp or balance issues. RLE: +nodule appreciated ~2cm above achilles insertion at calcaneus. +mild ttp over the nodule. Full ROM at the ankle. 5/5 strength in plantar and dorsiflexion. Distal pulses and sensation intact.  [place] Right Achilles Tendon  - Calcaneus and achilles tendon clearly visualized with insertion 0.44 mm in thickness  - Achilles tendon nodule ~0.76 mm in thickness noted 2 cm superior to the calcaneal insertion - No appreciable tear or gaps in the tendon Mild increase in doppler flow only Comparison view of Left Achilles shows no nodular swelling  Impression: chronic nodular Achilles tendinopathy  Ultrasound and  interpretation by Wolfgang Phoenix. Kaylamarie Swickard, MD     Assessment and Plan:  Achilles Tendonitis - Ultrasound in the office today demonstrated Right Achilles Tendon thickening consistent with a nodule. There was swelling and thickening of the tendon but no evidence of rupture.  - Alfredson Exercises, reviewed with patient in the room and handout provided - Arica gel OTC to help stimulate blood flow and healing (patient has history of migraines, will avoid topical nitroglycerin) - heel lifts placed in tennis shoes bilaterally - icing TID - Patient to decrease intensity of exercise for next 3 weeks and then resume gradually Recheck with me if not resolving in 2 to 3 months  Orders Placed This Encounter  Procedures  . [place] LIMITED JOINT SPACE STRUCTURES LOW RIGHT    Standing Status:   Future    Number of Occurrences:   1    Standing Expiration Date:   01/22/2019    Order Specific Question:   Reason for Exam (SYMPTOM  OR DIAGNOSIS REQUIRED)    Answer:   right achilles tendon pain    Order Specific Question:   Preferred imaging location?    Answer:   Internal    Everrett Coombe, MD PGY-2 [facility]  I observed and examined the patient with the resident and agree with assessment and plan.  Note reviewed and modified by me. Stefanie Libel, MD

## 2017-11-24 ENCOUNTER — Other Ambulatory Visit: Payer: Self-pay | Admitting: Obstetrics and Gynecology

## 2017-11-24 DIAGNOSIS — Z853 Personal history of malignant neoplasm of breast: Secondary | ICD-10-CM

## 2017-12-07 DIAGNOSIS — D225 Melanocytic nevi of trunk: Secondary | ICD-10-CM | POA: Diagnosis not present

## 2017-12-07 DIAGNOSIS — L578 Other skin changes due to chronic exposure to nonionizing radiation: Secondary | ICD-10-CM | POA: Diagnosis not present

## 2017-12-07 DIAGNOSIS — L820 Inflamed seborrheic keratosis: Secondary | ICD-10-CM | POA: Diagnosis not present

## 2017-12-07 DIAGNOSIS — L814 Other melanin hyperpigmentation: Secondary | ICD-10-CM | POA: Diagnosis not present

## 2017-12-07 DIAGNOSIS — D2262 Melanocytic nevi of left upper limb, including shoulder: Secondary | ICD-10-CM | POA: Diagnosis not present

## 2017-12-07 DIAGNOSIS — L570 Actinic keratosis: Secondary | ICD-10-CM | POA: Diagnosis not present

## 2017-12-15 ENCOUNTER — Ambulatory Visit
Admission: RE | Admit: 2017-12-15 | Discharge: 2017-12-15 | Disposition: A | Discharge status: Home / Self Care | Payer: Medicare Other | Source: Ambulatory Visit | Attending: Obstetrics and Gynecology | Admitting: Obstetrics and Gynecology

## 2017-12-15 DIAGNOSIS — Z853 Personal history of malignant neoplasm of breast: Secondary | ICD-10-CM

## 2017-12-15 DIAGNOSIS — R928 Other abnormal and inconclusive findings on diagnostic imaging of breast: Secondary | ICD-10-CM | POA: Diagnosis not present

## 2018-01-10 DIAGNOSIS — Z6821 Body mass index (BMI) 21.0-21.9, adult: Secondary | ICD-10-CM | POA: Diagnosis not present

## 2018-01-10 DIAGNOSIS — Z124 Encounter for screening for malignant neoplasm of cervix: Secondary | ICD-10-CM | POA: Diagnosis not present

## 2018-01-11 DIAGNOSIS — M7661 Achilles tendinitis, right leg: Secondary | ICD-10-CM | POA: Diagnosis not present

## 2018-01-11 DIAGNOSIS — M7541 Impingement syndrome of right shoulder: Secondary | ICD-10-CM | POA: Diagnosis not present

## 2018-03-24 DIAGNOSIS — M7661 Achilles tendinitis, right leg: Secondary | ICD-10-CM | POA: Diagnosis not present

## 2018-03-24 DIAGNOSIS — M79604 Pain in right leg: Secondary | ICD-10-CM | POA: Diagnosis not present

## 2018-03-24 DIAGNOSIS — M6289 Other specified disorders of muscle: Secondary | ICD-10-CM | POA: Diagnosis not present

## 2018-03-29 DIAGNOSIS — M6289 Other specified disorders of muscle: Secondary | ICD-10-CM | POA: Diagnosis not present

## 2018-03-29 DIAGNOSIS — M7661 Achilles tendinitis, right leg: Secondary | ICD-10-CM | POA: Diagnosis not present

## 2018-03-29 DIAGNOSIS — M25471 Effusion, right ankle: Secondary | ICD-10-CM | POA: Diagnosis not present

## 2018-03-29 DIAGNOSIS — M79604 Pain in right leg: Secondary | ICD-10-CM | POA: Diagnosis not present

## 2018-04-05 DIAGNOSIS — M25471 Effusion, right ankle: Secondary | ICD-10-CM | POA: Diagnosis not present

## 2018-04-05 DIAGNOSIS — M7661 Achilles tendinitis, right leg: Secondary | ICD-10-CM | POA: Diagnosis not present

## 2018-04-05 DIAGNOSIS — M6289 Other specified disorders of muscle: Secondary | ICD-10-CM | POA: Diagnosis not present

## 2018-04-25 DIAGNOSIS — M6289 Other specified disorders of muscle: Secondary | ICD-10-CM | POA: Diagnosis not present

## 2018-04-25 DIAGNOSIS — M6788 Other specified disorders of synovium and tendon, other site: Secondary | ICD-10-CM | POA: Diagnosis not present

## 2018-05-11 ENCOUNTER — Ambulatory Visit (INDEPENDENT_AMBULATORY_CARE_PROVIDER_SITE_OTHER): Payer: Self-pay | Admitting: *Deleted

## 2018-05-11 ENCOUNTER — Encounter: Payer: Self-pay | Admitting: Family

## 2018-05-11 ENCOUNTER — Ambulatory Visit: Payer: Medicare Other | Admitting: *Deleted

## 2018-05-11 DIAGNOSIS — I83899 Varicose veins of unspecified lower extremities with other complications: Secondary | ICD-10-CM

## 2018-05-11 DIAGNOSIS — I8393 Asymptomatic varicose veins of bilateral lower extremities: Secondary | ICD-10-CM

## 2018-05-11 DIAGNOSIS — I868 Varicose veins of other specified sites: Secondary | ICD-10-CM

## 2018-05-11 NOTE — Progress Notes (Signed)
X=.3% Sotradecol administered with a 27g butterfly.  Patient received a total of 6cc.  Treated the large reticulars in her right calf in the back. Easy access. Tol well. Anticipate good results. Follow prn.  Photos: Yes.    Compression stockings applied: Yes.  and a 4" ace

## 2018-06-02 DIAGNOSIS — L814 Other melanin hyperpigmentation: Secondary | ICD-10-CM | POA: Diagnosis not present

## 2018-06-02 DIAGNOSIS — L570 Actinic keratosis: Secondary | ICD-10-CM | POA: Diagnosis not present

## 2018-06-02 DIAGNOSIS — L821 Other seborrheic keratosis: Secondary | ICD-10-CM | POA: Diagnosis not present

## 2018-06-02 DIAGNOSIS — L820 Inflamed seborrheic keratosis: Secondary | ICD-10-CM | POA: Diagnosis not present

## 2018-06-02 DIAGNOSIS — D225 Melanocytic nevi of trunk: Secondary | ICD-10-CM | POA: Diagnosis not present

## 2018-06-26 DIAGNOSIS — Z23 Encounter for immunization: Secondary | ICD-10-CM | POA: Diagnosis not present

## 2018-07-24 DIAGNOSIS — Z0000 Encounter for general adult medical examination without abnormal findings: Secondary | ICD-10-CM | POA: Diagnosis not present

## 2018-07-24 DIAGNOSIS — Z608 Other problems related to social environment: Secondary | ICD-10-CM | POA: Diagnosis not present

## 2018-07-24 DIAGNOSIS — B009 Herpesviral infection, unspecified: Secondary | ICD-10-CM | POA: Diagnosis not present

## 2018-07-24 DIAGNOSIS — Z1389 Encounter for screening for other disorder: Secondary | ICD-10-CM | POA: Diagnosis not present

## 2018-07-24 DIAGNOSIS — G43019 Migraine without aura, intractable, without status migrainosus: Secondary | ICD-10-CM | POA: Diagnosis not present

## 2018-07-24 DIAGNOSIS — R739 Hyperglycemia, unspecified: Secondary | ICD-10-CM | POA: Diagnosis not present

## 2018-07-24 DIAGNOSIS — K219 Gastro-esophageal reflux disease without esophagitis: Secondary | ICD-10-CM | POA: Diagnosis not present

## 2018-07-24 DIAGNOSIS — G4700 Insomnia, unspecified: Secondary | ICD-10-CM | POA: Diagnosis not present

## 2018-07-24 DIAGNOSIS — Z136 Encounter for screening for cardiovascular disorders: Secondary | ICD-10-CM | POA: Diagnosis not present

## 2018-07-24 DIAGNOSIS — R7309 Other abnormal glucose: Secondary | ICD-10-CM | POA: Diagnosis not present

## 2018-07-24 DIAGNOSIS — Z853 Personal history of malignant neoplasm of breast: Secondary | ICD-10-CM | POA: Diagnosis not present

## 2018-09-21 DIAGNOSIS — L814 Other melanin hyperpigmentation: Secondary | ICD-10-CM | POA: Diagnosis not present

## 2018-09-21 DIAGNOSIS — L820 Inflamed seborrheic keratosis: Secondary | ICD-10-CM | POA: Diagnosis not present

## 2018-09-21 DIAGNOSIS — L821 Other seborrheic keratosis: Secondary | ICD-10-CM | POA: Diagnosis not present

## 2018-10-03 DIAGNOSIS — H2513 Age-related nuclear cataract, bilateral: Secondary | ICD-10-CM | POA: Diagnosis not present

## 2018-11-25 DIAGNOSIS — M79621 Pain in right upper arm: Secondary | ICD-10-CM | POA: Diagnosis not present

## 2018-11-25 DIAGNOSIS — T63481A Toxic effect of venom of other arthropod, accidental (unintentional), initial encounter: Secondary | ICD-10-CM | POA: Diagnosis not present

## 2018-12-05 ENCOUNTER — Other Ambulatory Visit: Payer: Self-pay | Admitting: Obstetrics and Gynecology

## 2018-12-05 DIAGNOSIS — Z1231 Encounter for screening mammogram for malignant neoplasm of breast: Secondary | ICD-10-CM

## 2018-12-27 DIAGNOSIS — L237 Allergic contact dermatitis due to plants, except food: Secondary | ICD-10-CM | POA: Diagnosis not present

## 2019-01-16 ENCOUNTER — Other Ambulatory Visit: Payer: Self-pay

## 2019-01-16 ENCOUNTER — Ambulatory Visit
Admission: RE | Admit: 2019-01-16 | Discharge: 2019-01-16 | Disposition: A | Discharge status: Home / Self Care | Payer: Medicare Other | Source: Ambulatory Visit | Attending: Obstetrics and Gynecology | Admitting: Obstetrics and Gynecology

## 2019-01-16 DIAGNOSIS — Z1231 Encounter for screening mammogram for malignant neoplasm of breast: Secondary | ICD-10-CM

## 2019-01-17 DIAGNOSIS — N951 Menopausal and female climacteric states: Secondary | ICD-10-CM | POA: Diagnosis not present

## 2019-01-17 DIAGNOSIS — M8588 Other specified disorders of bone density and structure, other site: Secondary | ICD-10-CM | POA: Diagnosis not present

## 2019-02-06 DIAGNOSIS — L738 Other specified follicular disorders: Secondary | ICD-10-CM | POA: Diagnosis not present

## 2019-02-06 DIAGNOSIS — L821 Other seborrheic keratosis: Secondary | ICD-10-CM | POA: Diagnosis not present

## 2019-02-06 DIAGNOSIS — L820 Inflamed seborrheic keratosis: Secondary | ICD-10-CM | POA: Diagnosis not present

## 2019-02-06 DIAGNOSIS — L720 Epidermal cyst: Secondary | ICD-10-CM | POA: Diagnosis not present

## 2019-04-12 DIAGNOSIS — Z23 Encounter for immunization: Secondary | ICD-10-CM | POA: Diagnosis not present

## 2019-04-30 DIAGNOSIS — R6889 Other general symptoms and signs: Secondary | ICD-10-CM | POA: Diagnosis not present

## 2019-04-30 DIAGNOSIS — M47812 Spondylosis without myelopathy or radiculopathy, cervical region: Secondary | ICD-10-CM | POA: Diagnosis not present

## 2019-04-30 DIAGNOSIS — M542 Cervicalgia: Secondary | ICD-10-CM | POA: Diagnosis not present

## 2019-04-30 DIAGNOSIS — R030 Elevated blood-pressure reading, without diagnosis of hypertension: Secondary | ICD-10-CM | POA: Diagnosis not present

## 2019-08-08 DIAGNOSIS — B009 Herpesviral infection, unspecified: Secondary | ICD-10-CM | POA: Diagnosis not present

## 2019-08-08 DIAGNOSIS — G4700 Insomnia, unspecified: Secondary | ICD-10-CM | POA: Diagnosis not present

## 2019-08-08 DIAGNOSIS — Z853 Personal history of malignant neoplasm of breast: Secondary | ICD-10-CM | POA: Diagnosis not present

## 2019-08-08 DIAGNOSIS — R739 Hyperglycemia, unspecified: Secondary | ICD-10-CM | POA: Diagnosis not present

## 2019-08-08 DIAGNOSIS — G43019 Migraine without aura, intractable, without status migrainosus: Secondary | ICD-10-CM | POA: Diagnosis not present

## 2019-08-08 DIAGNOSIS — Z0001 Encounter for general adult medical examination with abnormal findings: Secondary | ICD-10-CM | POA: Diagnosis not present

## 2019-08-08 DIAGNOSIS — K219 Gastro-esophageal reflux disease without esophagitis: Secondary | ICD-10-CM | POA: Diagnosis not present

## 2019-08-08 DIAGNOSIS — E559 Vitamin D deficiency, unspecified: Secondary | ICD-10-CM | POA: Diagnosis not present

## 2019-08-08 DIAGNOSIS — Z79899 Other long term (current) drug therapy: Secondary | ICD-10-CM | POA: Diagnosis not present

## 2019-08-08 DIAGNOSIS — Z608 Other problems related to social environment: Secondary | ICD-10-CM | POA: Diagnosis not present

## 2019-08-18 ENCOUNTER — Ambulatory Visit: Payer: Medicare Other

## 2019-08-18 DIAGNOSIS — Z23 Encounter for immunization: Secondary | ICD-10-CM | POA: Diagnosis not present

## 2019-08-20 ENCOUNTER — Ambulatory Visit: Payer: Medicare Other

## 2019-08-23 ENCOUNTER — Ambulatory Visit: Payer: Medicare Other

## 2019-09-10 ENCOUNTER — Ambulatory Visit: Payer: Medicare Other

## 2019-09-15 DIAGNOSIS — Z23 Encounter for immunization: Secondary | ICD-10-CM | POA: Diagnosis not present

## 2019-10-07 IMAGING — MG DIGITAL SCREENING BILATERAL MAMMOGRAM WITH TOMO AND CAD
6 of 10 series · 6 of 30 positions shown · non-contrast
Comparison: Previous exam(s).

CLINICAL DATA: Screening.

EXAM:
DIGITAL SCREENING BILATERAL MAMMOGRAM WITH TOMO AND CAD

[L CC synth-2D]
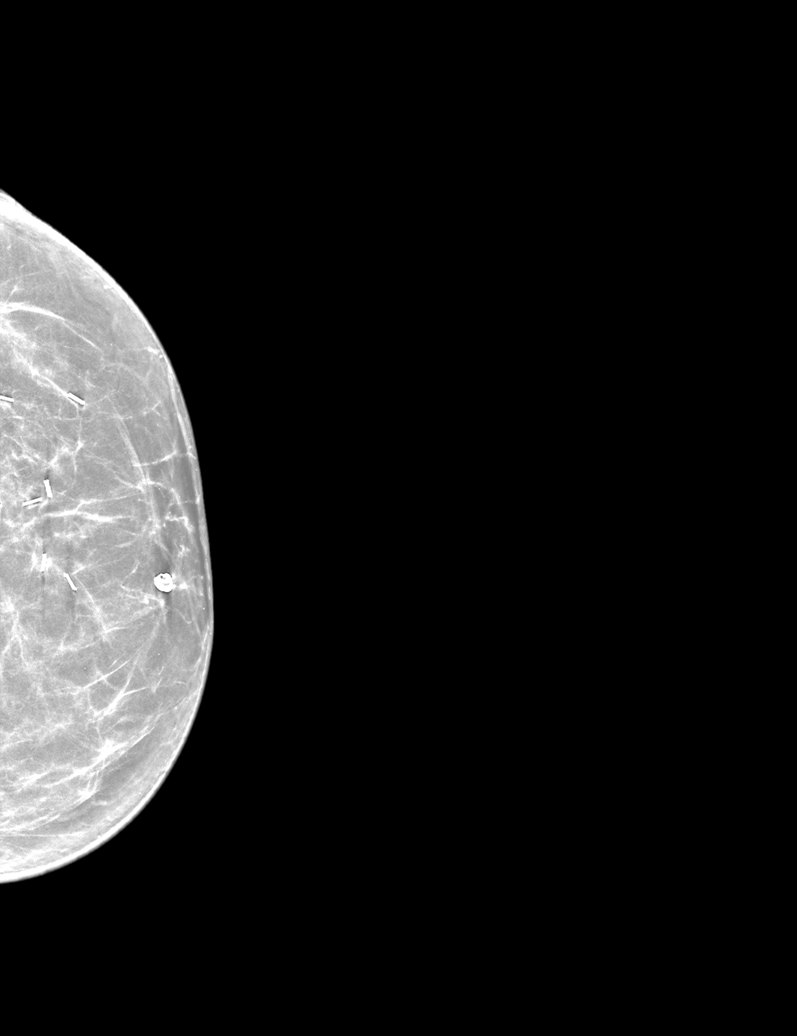

[R MLO synth-2D]
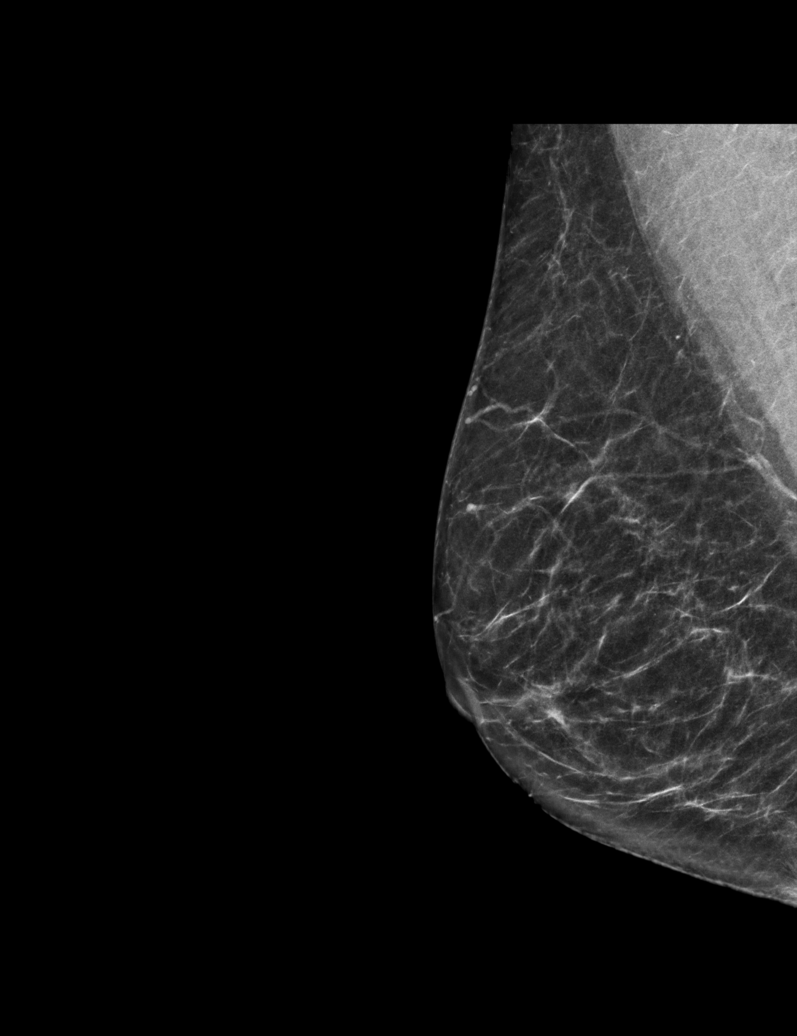

[L XCCL synth-2D]
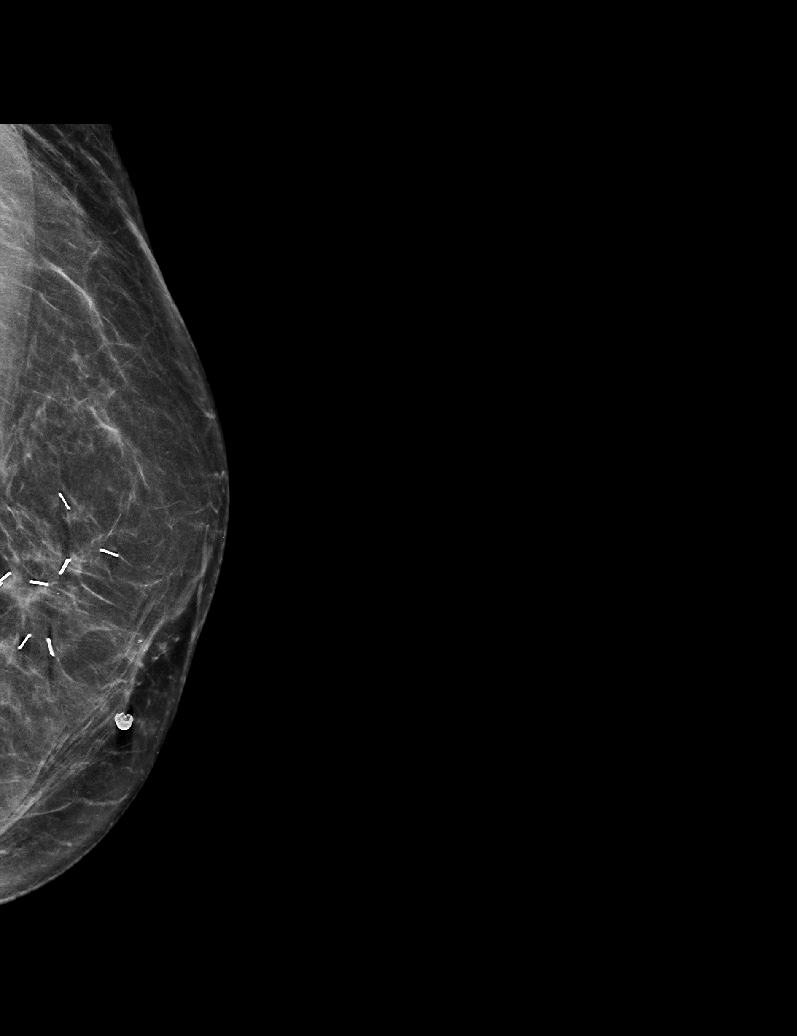

[R CC synth-2D]
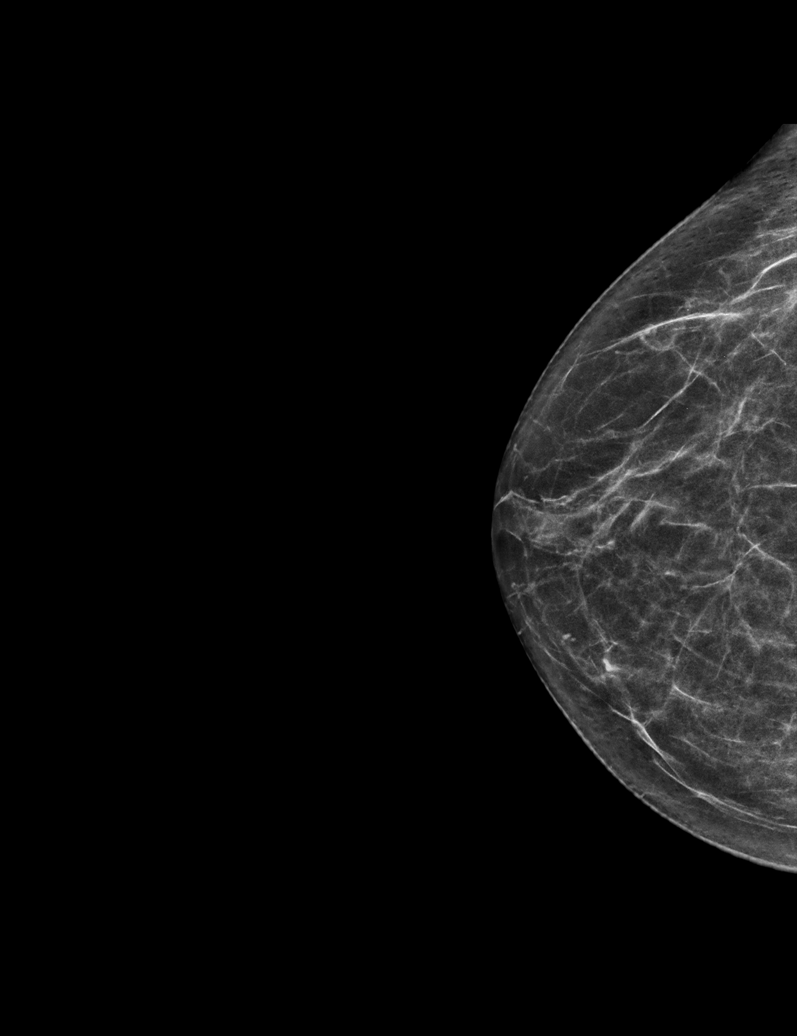

[L MLO synth-2D]
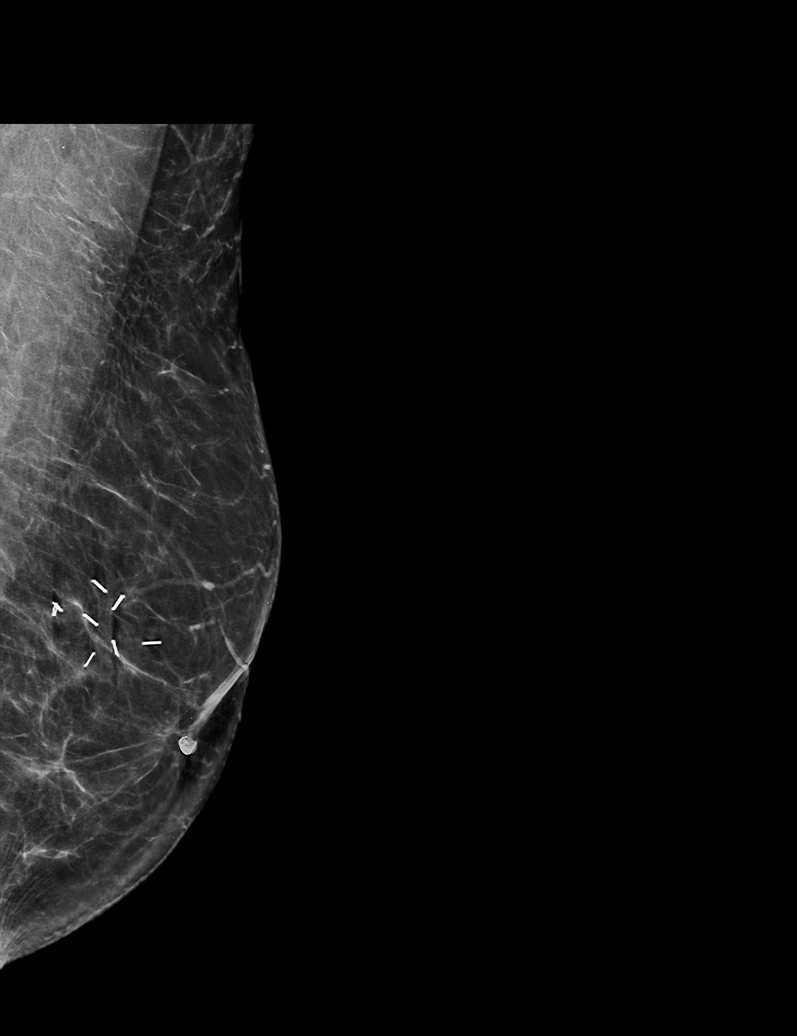

[R CC tomo · tomo slice 29/56.0]
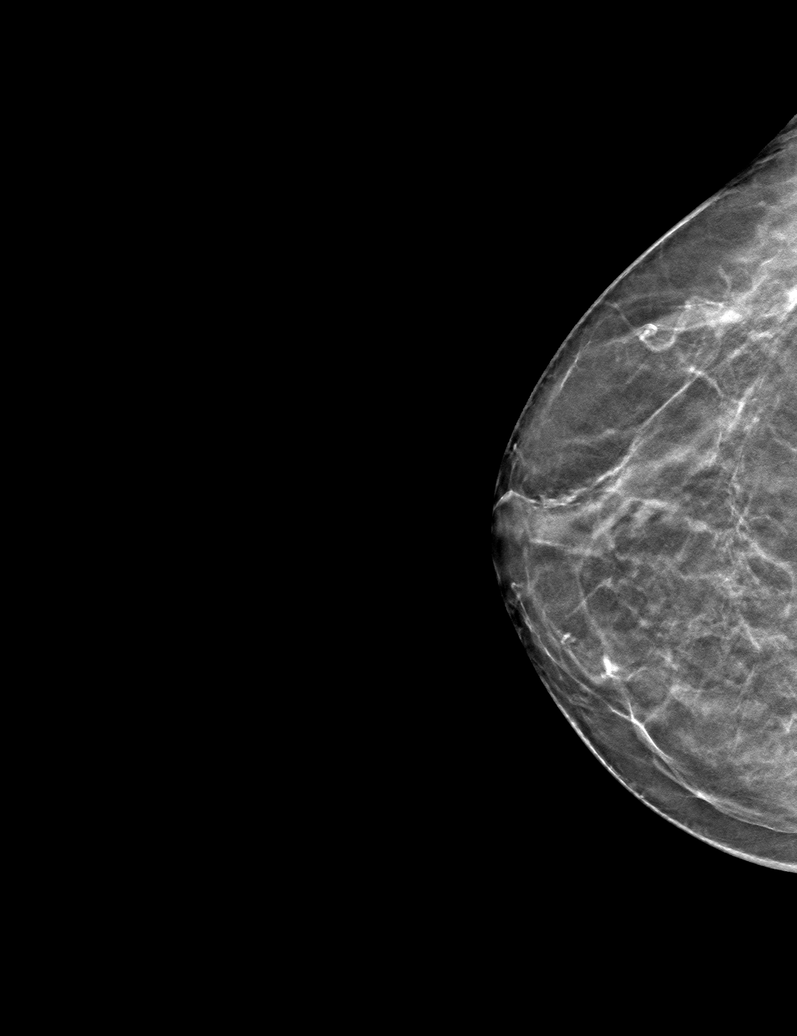

[6 of 30 positions shown; findings below may reference images not displayed]

ACR Breast Density Category b: There are scattered areas of
fibroglandular density.
FINDINGS: There are no findings suspicious for malignancy. Images were
processed with CAD.
IMPRESSION: No mammographic evidence of malignancy. A result letter of this
screening mammogram will be mailed directly to the patient.

RECOMMENDATION:
Screening mammogram in one year. (Code:[id number])

BI-RADS CATEGORY  1: Negative.

## 2019-10-25 DIAGNOSIS — H524 Presbyopia: Secondary | ICD-10-CM | POA: Diagnosis not present

## 2019-10-25 DIAGNOSIS — H5203 Hypermetropia, bilateral: Secondary | ICD-10-CM | POA: Diagnosis not present

## 2019-10-25 DIAGNOSIS — H2513 Age-related nuclear cataract, bilateral: Secondary | ICD-10-CM | POA: Diagnosis not present

## 2019-11-01 DIAGNOSIS — D2262 Melanocytic nevi of left upper limb, including shoulder: Secondary | ICD-10-CM | POA: Diagnosis not present

## 2019-11-01 DIAGNOSIS — D1801 Hemangioma of skin and subcutaneous tissue: Secondary | ICD-10-CM | POA: Diagnosis not present

## 2019-11-01 DIAGNOSIS — L820 Inflamed seborrheic keratosis: Secondary | ICD-10-CM | POA: Diagnosis not present

## 2019-11-01 DIAGNOSIS — D225 Melanocytic nevi of trunk: Secondary | ICD-10-CM | POA: Diagnosis not present

## 2019-11-01 DIAGNOSIS — L570 Actinic keratosis: Secondary | ICD-10-CM | POA: Diagnosis not present

## 2019-11-01 DIAGNOSIS — L814 Other melanin hyperpigmentation: Secondary | ICD-10-CM | POA: Diagnosis not present

## 2019-11-01 DIAGNOSIS — L821 Other seborrheic keratosis: Secondary | ICD-10-CM | POA: Diagnosis not present

## 2019-11-01 DIAGNOSIS — D2261 Melanocytic nevi of right upper limb, including shoulder: Secondary | ICD-10-CM | POA: Diagnosis not present

## 2019-11-01 DIAGNOSIS — L720 Epidermal cyst: Secondary | ICD-10-CM | POA: Diagnosis not present

## 2019-11-27 DIAGNOSIS — H00011 Hordeolum externum right upper eyelid: Secondary | ICD-10-CM | POA: Diagnosis not present

## 2019-12-04 DIAGNOSIS — H0011 Chalazion right upper eyelid: Secondary | ICD-10-CM | POA: Diagnosis not present

## 2019-12-07 DIAGNOSIS — J209 Acute bronchitis, unspecified: Secondary | ICD-10-CM | POA: Diagnosis not present

## 2019-12-18 ENCOUNTER — Other Ambulatory Visit: Payer: Self-pay | Admitting: Obstetrics and Gynecology

## 2019-12-18 DIAGNOSIS — Z1231 Encounter for screening mammogram for malignant neoplasm of breast: Secondary | ICD-10-CM

## 2020-01-23 ENCOUNTER — Ambulatory Visit: Payer: Medicare Other

## 2020-02-04 ENCOUNTER — Ambulatory Visit
Admission: RE | Admit: 2020-02-04 | Discharge: 2020-02-04 | Disposition: A | Discharge status: Home / Self Care | Payer: Medicare Other | Source: Ambulatory Visit | Attending: Obstetrics and Gynecology | Admitting: Obstetrics and Gynecology

## 2020-02-04 ENCOUNTER — Other Ambulatory Visit: Payer: Self-pay

## 2020-02-04 DIAGNOSIS — Z1231 Encounter for screening mammogram for malignant neoplasm of breast: Secondary | ICD-10-CM

## 2020-02-19 DIAGNOSIS — Z124 Encounter for screening for malignant neoplasm of cervix: Secondary | ICD-10-CM | POA: Diagnosis not present

## 2020-02-19 DIAGNOSIS — Z6821 Body mass index (BMI) 21.0-21.9, adult: Secondary | ICD-10-CM | POA: Diagnosis not present

## 2020-02-19 DIAGNOSIS — N819 Female genital prolapse, unspecified: Secondary | ICD-10-CM | POA: Diagnosis not present

## 2020-03-10 DIAGNOSIS — M25511 Pain in right shoulder: Secondary | ICD-10-CM | POA: Diagnosis not present

## 2020-03-15 DIAGNOSIS — M25511 Pain in right shoulder: Secondary | ICD-10-CM | POA: Diagnosis not present

## 2020-04-23 DIAGNOSIS — L820 Inflamed seborrheic keratosis: Secondary | ICD-10-CM | POA: Diagnosis not present

## 2020-04-23 DIAGNOSIS — Z85828 Personal history of other malignant neoplasm of skin: Secondary | ICD-10-CM | POA: Diagnosis not present

## 2020-04-23 DIAGNOSIS — C44529 Squamous cell carcinoma of skin of other part of trunk: Secondary | ICD-10-CM | POA: Diagnosis not present

## 2020-04-30 DIAGNOSIS — Z23 Encounter for immunization: Secondary | ICD-10-CM | POA: Diagnosis not present

## 2020-05-14 DIAGNOSIS — N816 Rectocele: Secondary | ICD-10-CM | POA: Diagnosis not present

## 2020-05-14 DIAGNOSIS — N813 Complete uterovaginal prolapse: Secondary | ICD-10-CM | POA: Diagnosis not present

## 2020-05-14 DIAGNOSIS — N362 Urethral caruncle: Secondary | ICD-10-CM | POA: Diagnosis not present

## 2020-05-14 DIAGNOSIS — N952 Postmenopausal atrophic vaginitis: Secondary | ICD-10-CM | POA: Diagnosis not present

## 2020-06-05 DIAGNOSIS — D485 Neoplasm of uncertain behavior of skin: Secondary | ICD-10-CM | POA: Diagnosis not present

## 2020-06-05 DIAGNOSIS — L820 Inflamed seborrheic keratosis: Secondary | ICD-10-CM | POA: Diagnosis not present

## 2020-06-05 DIAGNOSIS — L570 Actinic keratosis: Secondary | ICD-10-CM | POA: Diagnosis not present

## 2020-06-05 DIAGNOSIS — L821 Other seborrheic keratosis: Secondary | ICD-10-CM | POA: Diagnosis not present

## 2020-06-05 DIAGNOSIS — Z85828 Personal history of other malignant neoplasm of skin: Secondary | ICD-10-CM | POA: Diagnosis not present

## 2020-06-17 DIAGNOSIS — Z23 Encounter for immunization: Secondary | ICD-10-CM | POA: Diagnosis not present

## 2020-06-18 DIAGNOSIS — N812 Incomplete uterovaginal prolapse: Secondary | ICD-10-CM | POA: Diagnosis not present

## 2020-08-18 DIAGNOSIS — L570 Actinic keratosis: Secondary | ICD-10-CM | POA: Diagnosis not present

## 2020-08-18 DIAGNOSIS — L814 Other melanin hyperpigmentation: Secondary | ICD-10-CM | POA: Diagnosis not present

## 2020-08-18 DIAGNOSIS — L820 Inflamed seborrheic keratosis: Secondary | ICD-10-CM | POA: Diagnosis not present

## 2020-08-18 DIAGNOSIS — Z85828 Personal history of other malignant neoplasm of skin: Secondary | ICD-10-CM | POA: Diagnosis not present

## 2020-09-10 DIAGNOSIS — N3281 Overactive bladder: Secondary | ICD-10-CM | POA: Diagnosis not present

## 2020-09-12 DIAGNOSIS — N813 Complete uterovaginal prolapse: Secondary | ICD-10-CM | POA: Diagnosis not present

## 2020-09-12 DIAGNOSIS — N952 Postmenopausal atrophic vaginitis: Secondary | ICD-10-CM | POA: Diagnosis not present

## 2020-09-12 DIAGNOSIS — N182 Chronic kidney disease, stage 2 (mild): Secondary | ICD-10-CM | POA: Diagnosis not present

## 2020-09-12 DIAGNOSIS — N812 Incomplete uterovaginal prolapse: Secondary | ICD-10-CM | POA: Diagnosis not present

## 2020-09-12 DIAGNOSIS — N362 Urethral caruncle: Secondary | ICD-10-CM | POA: Diagnosis not present

## 2020-11-13 DIAGNOSIS — H5203 Hypermetropia, bilateral: Secondary | ICD-10-CM | POA: Diagnosis not present

## 2020-11-13 DIAGNOSIS — H2513 Age-related nuclear cataract, bilateral: Secondary | ICD-10-CM | POA: Diagnosis not present

## 2020-11-24 DIAGNOSIS — D2272 Melanocytic nevi of left lower limb, including hip: Secondary | ICD-10-CM | POA: Diagnosis not present

## 2020-11-24 DIAGNOSIS — L821 Other seborrheic keratosis: Secondary | ICD-10-CM | POA: Diagnosis not present

## 2020-11-24 DIAGNOSIS — L814 Other melanin hyperpigmentation: Secondary | ICD-10-CM | POA: Diagnosis not present

## 2020-11-24 DIAGNOSIS — D225 Melanocytic nevi of trunk: Secondary | ICD-10-CM | POA: Diagnosis not present

## 2020-11-24 DIAGNOSIS — L570 Actinic keratosis: Secondary | ICD-10-CM | POA: Diagnosis not present

## 2020-11-24 DIAGNOSIS — D1801 Hemangioma of skin and subcutaneous tissue: Secondary | ICD-10-CM | POA: Diagnosis not present

## 2020-11-24 DIAGNOSIS — K5900 Constipation, unspecified: Secondary | ICD-10-CM | POA: Diagnosis not present

## 2020-11-24 DIAGNOSIS — L820 Inflamed seborrheic keratosis: Secondary | ICD-10-CM | POA: Diagnosis not present

## 2020-11-24 DIAGNOSIS — Z85828 Personal history of other malignant neoplasm of skin: Secondary | ICD-10-CM | POA: Diagnosis not present

## 2020-12-31 ENCOUNTER — Other Ambulatory Visit: Payer: Self-pay | Admitting: Internal Medicine

## 2020-12-31 DIAGNOSIS — Z1231 Encounter for screening mammogram for malignant neoplasm of breast: Secondary | ICD-10-CM

## 2021-01-28 DIAGNOSIS — K5902 Outlet dysfunction constipation: Secondary | ICD-10-CM | POA: Diagnosis not present

## 2021-01-28 DIAGNOSIS — R195 Other fecal abnormalities: Secondary | ICD-10-CM | POA: Diagnosis not present

## 2021-02-24 ENCOUNTER — Ambulatory Visit
Admission: RE | Admit: 2021-02-24 | Discharge: 2021-02-24 | Disposition: A | Discharge status: Home / Self Care | Payer: Medicare Other | Source: Ambulatory Visit | Attending: Internal Medicine | Admitting: Internal Medicine

## 2021-02-24 ENCOUNTER — Other Ambulatory Visit: Payer: Self-pay

## 2021-02-24 DIAGNOSIS — Z1231 Encounter for screening mammogram for malignant neoplasm of breast: Secondary | ICD-10-CM | POA: Diagnosis not present

## 2021-02-26 DIAGNOSIS — Z01419 Encounter for gynecological examination (general) (routine) without abnormal findings: Secondary | ICD-10-CM | POA: Diagnosis not present

## 2021-02-26 DIAGNOSIS — N958 Other specified menopausal and perimenopausal disorders: Secondary | ICD-10-CM | POA: Diagnosis not present

## 2021-02-26 DIAGNOSIS — M8588 Other specified disorders of bone density and structure, other site: Secondary | ICD-10-CM | POA: Diagnosis not present

## 2021-02-26 DIAGNOSIS — Z6822 Body mass index (BMI) 22.0-22.9, adult: Secondary | ICD-10-CM | POA: Diagnosis not present

## 2021-04-24 DIAGNOSIS — D485 Neoplasm of uncertain behavior of skin: Secondary | ICD-10-CM | POA: Diagnosis not present

## 2021-04-24 DIAGNOSIS — Z85828 Personal history of other malignant neoplasm of skin: Secondary | ICD-10-CM | POA: Diagnosis not present

## 2021-04-24 DIAGNOSIS — D2361 Other benign neoplasm of skin of right upper limb, including shoulder: Secondary | ICD-10-CM | POA: Diagnosis not present

## 2021-04-24 DIAGNOSIS — C44622 Squamous cell carcinoma of skin of right upper limb, including shoulder: Secondary | ICD-10-CM | POA: Diagnosis not present

## 2021-04-24 DIAGNOSIS — L821 Other seborrheic keratosis: Secondary | ICD-10-CM | POA: Diagnosis not present

## 2021-05-06 DIAGNOSIS — Z23 Encounter for immunization: Secondary | ICD-10-CM | POA: Diagnosis not present

## 2021-06-04 DIAGNOSIS — K5901 Slow transit constipation: Secondary | ICD-10-CM | POA: Diagnosis not present

## 2021-06-04 DIAGNOSIS — Z8742 Personal history of other diseases of the female genital tract: Secondary | ICD-10-CM | POA: Diagnosis not present

## 2021-06-30 DIAGNOSIS — R194 Change in bowel habit: Secondary | ICD-10-CM | POA: Diagnosis not present

## 2021-06-30 DIAGNOSIS — K635 Polyp of colon: Secondary | ICD-10-CM | POA: Diagnosis not present

## 2021-06-30 DIAGNOSIS — K5730 Diverticulosis of large intestine without perforation or abscess without bleeding: Secondary | ICD-10-CM | POA: Diagnosis not present

## 2021-06-30 DIAGNOSIS — R150 Incomplete defecation: Secondary | ICD-10-CM | POA: Diagnosis not present

## 2021-06-30 DIAGNOSIS — D122 Benign neoplasm of ascending colon: Secondary | ICD-10-CM | POA: Diagnosis not present

## 2021-06-30 DIAGNOSIS — K5902 Outlet dysfunction constipation: Secondary | ICD-10-CM | POA: Diagnosis not present

## 2021-06-30 DIAGNOSIS — K6389 Other specified diseases of intestine: Secondary | ICD-10-CM | POA: Diagnosis not present

## 2021-07-07 DIAGNOSIS — Z136 Encounter for screening for cardiovascular disorders: Secondary | ICD-10-CM | POA: Diagnosis not present

## 2021-07-07 DIAGNOSIS — G43009 Migraine without aura, not intractable, without status migrainosus: Secondary | ICD-10-CM | POA: Diagnosis not present

## 2021-07-07 DIAGNOSIS — R739 Hyperglycemia, unspecified: Secondary | ICD-10-CM | POA: Diagnosis not present

## 2021-07-07 DIAGNOSIS — Z608 Other problems related to social environment: Secondary | ICD-10-CM | POA: Diagnosis not present

## 2021-07-07 DIAGNOSIS — K219 Gastro-esophageal reflux disease without esophagitis: Secondary | ICD-10-CM | POA: Diagnosis not present

## 2021-07-09 DIAGNOSIS — Z20822 Contact with and (suspected) exposure to covid-19: Secondary | ICD-10-CM | POA: Diagnosis not present

## 2021-08-04 DIAGNOSIS — L0291 Cutaneous abscess, unspecified: Secondary | ICD-10-CM | POA: Diagnosis not present

## 2021-08-10 DIAGNOSIS — N9089 Other specified noninflammatory disorders of vulva and perineum: Secondary | ICD-10-CM | POA: Diagnosis not present

## 2021-09-22 DIAGNOSIS — L820 Inflamed seborrheic keratosis: Secondary | ICD-10-CM | POA: Diagnosis not present

## 2021-09-22 DIAGNOSIS — L821 Other seborrheic keratosis: Secondary | ICD-10-CM | POA: Diagnosis not present

## 2021-09-22 DIAGNOSIS — L570 Actinic keratosis: Secondary | ICD-10-CM | POA: Diagnosis not present

## 2021-09-22 DIAGNOSIS — Z85828 Personal history of other malignant neoplasm of skin: Secondary | ICD-10-CM | POA: Diagnosis not present

## 2021-09-24 ENCOUNTER — Other Ambulatory Visit: Payer: Self-pay | Admitting: Internal Medicine

## 2021-09-24 DIAGNOSIS — Z136 Encounter for screening for cardiovascular disorders: Secondary | ICD-10-CM

## 2021-10-21 ENCOUNTER — Ambulatory Visit
Admission: RE | Admit: 2021-10-21 | Discharge: 2021-10-21 | Disposition: A | Discharge status: Home / Self Care | Payer: No Typology Code available for payment source | Source: Ambulatory Visit | Attending: Internal Medicine | Admitting: Internal Medicine

## 2021-10-21 DIAGNOSIS — Z136 Encounter for screening for cardiovascular disorders: Secondary | ICD-10-CM

## 2021-10-26 ENCOUNTER — Other Ambulatory Visit: Payer: Medicare Other

## 2021-10-29 DIAGNOSIS — H0014 Chalazion left upper eyelid: Secondary | ICD-10-CM | POA: Diagnosis not present

## 2021-11-30 DIAGNOSIS — H2513 Age-related nuclear cataract, bilateral: Secondary | ICD-10-CM | POA: Diagnosis not present

## 2021-11-30 DIAGNOSIS — H524 Presbyopia: Secondary | ICD-10-CM | POA: Diagnosis not present

## 2021-12-03 DIAGNOSIS — Z853 Personal history of malignant neoplasm of breast: Secondary | ICD-10-CM | POA: Diagnosis not present

## 2021-12-21 ENCOUNTER — Ambulatory Visit (INDEPENDENT_AMBULATORY_CARE_PROVIDER_SITE_OTHER): Payer: Medicare Other | Admitting: Internal Medicine

## 2021-12-21 ENCOUNTER — Encounter: Payer: Self-pay | Admitting: Internal Medicine

## 2021-12-21 DIAGNOSIS — R918 Other nonspecific abnormal finding of lung field: Secondary | ICD-10-CM

## 2021-12-21 DIAGNOSIS — R053 Chronic cough: Secondary | ICD-10-CM | POA: Diagnosis not present

## 2021-12-21 NOTE — Assessment & Plan Note (Signed)
Onset around 2013 Recurrent typically flaring with URI's c/w Upper airway cough syndrome (previously labeled PNDS),  is so named because it's frequently impossible to sort out how much is  CR/sinusitis with freq throat clearing (which can be related to primary GERD)   vs  causing  secondary (" extra esophageal")  GERD from wide swings in gastric pressure that occur with throat clearing, often  promoting self use of mint and menthol lozenges that reduce the lower esophageal sphincter tone and exacerbate the problem further in a cyclical fashion.   These are the same pts (now being labeled as having "irritable larynx syndrome" by some cough centers) who not infrequently have a history of having failed to tolerate ace inhibitors,  dry powder inhalers or biphosphonates or report having atypical/extraesophageal reflux symptoms that don't respond to standard doses of PPI  and are easily confused as having aecopd or asthma flares by even experienced allergists/ pulmonologists (myself included).   Rec: gerd rx for flares see avs for instructions unique to this ov          Each maintenance medication was reviewed in detail including emphasizing most importantly the difference between maintenance and prns and under what circumstances the prns are to be triggered using an action plan format where appropriate.  Total time for H and P, chart review, counseling,  and generating customized AVS unique to this office visit / same day charting  > 30 min for pt not seen in > 3 y

## 2021-12-21 NOTE — Assessment & Plan Note (Signed)
CT chest cuts on coronary study  10/21/21 Multiple small subpleural nodules predominantly in an anterior and anterolateral distribution. Some of these may relate to prior breast radiation therapy. Multiple pulmonary nodules. Most severe: 5 mm right solid pulmonary nodule within the upper lobe. If patient is low risk for malignancy, no routine follow-up imaging is recommended; if patient is high risk for malignancy, a non-contrast Chest CT at 12 months is optional  She is relatively low but not zero risk and I favor a f/u at 15 m to be complete based on min smoking hx and h/o breast Ca and RT for breast ca in 2015   CT results reviewed with pt >>> Too small for PET or bx, not suspicious enough for excisional bx > really only option for now is follow the Fleischner society guidelines as rec by radiology which are intended to pick up all significant neoplasms in a timely enough manner to intervene if any of these nodules shows growth  >>>> full chest CT due 10/21/21/ placed in reminder file

## 2021-12-21 NOTE — Progress Notes (Signed)
Subjective:    Patient ID: Mary Bartlett, female    DOB: 08/18/1950  MRN: [id number]  HPI  56  yowf min smoking hx with no sign allergy/asthma hx and previous cough ? Reflux related self referred 05/16/2012 for recurrent cough.  05/16/2012 1st pulmonary eval cc new  cough indolent onset x 6 weeks present daily mostly dry ? Better p prilosec 20 mg in am then again @ bedtime then gets worse again. Does seem worse p eating and using mint/menthol products. No overt HB or dysphagia or sinus complaints nor sob/ wheezing.   Also denies any obvious fluctuation of symptoms with weather or environmental changes or other aggravating or alleviating factors except as outlined above      Kouffman Reflux v Neurogenic Cough Differentiator Reflux Comments  Do you awaken from a sound sleep coughing violently?                            With trouble breathing? some   Do you have choking episodes when you cannot  Get enough air, gasping for air ?              no   Do you usually cough when you lie down into  The bed, or when you just lie down to rest ?                          yes   Do you usually cough after meals or eating?         Yes   Do you cough when (or after) you bend over?    no   GERD SCORE     Kouffman Reflux v Neurogenic Cough Differentiator Neurogenic   Do you more-or-less cough all day long? some   Does change of temperature make you cough? no   Does laughing or chuckling cause you to cough? yes   Do fumes (perfume, automobile fumes, burned  Toast, etc.,) cause you to cough ?      maybe   Does speaking, singing, or talking on the phone cause you to cough   ?               Yes    Neurogenic/Airway score     Rec The key to effective treatment for your cough is eliminating the non-stop cycle of cough First take delsym two tsp every 12 hours and supplement if needed with  tramadol 50 mg up to 1-2 every 4 hours to suppress the urge to cough. Swallowing water or using ice chips/non mint and  menthol containing candies (such as lifesavers or sugarless jolly ranchers) are also effective.  You should rest your voice and avoid activities that you know make you cough. Once you have eliminated the cough for 3 straight days try reducing the tramadol first,  then the delsym as tolerated.   Try prilosec '20mg'$   Take 2  30-60 min before first meal of the day and Pepcid 20 mg one bedtime until cough is completely gone for at least a week without the need for cough suppression GERD diet reviewed, bed blocks rec  Prednisone 10 mg take  4 each am x 2 days,   2 each am x 2 days,  1 each am x 2 days and stop  Reported cough resolved     12/21/2021    Consultation re MPNs  Chief Complaint  Patient presents with  Pulmonary Consult    Self referral for MPN found incidentally on cardiac score. She has hx of Breast CA.   Breast bx 2015 intraductal ca in situ L side 2015  RT only >>>  clean mammo's yearly/ bracca gene neg  Dyspnea:  tennis/ golf / walks  Cough: clears throat  a lot but doesn't really think it's different than baseline, much worse with URIs no longer on gerd rx  Sleeping: fine, no resp cc SABA use: none  02: none  Covid status:   Christmas 2022 with flare of cough gradually improved assoc with lots of sensation of residual pnds   No obvious day to day or daytime variability or assoc excess/ purulent sputum or mucus plugs or hemoptysis or cp or chest tightness, subjective wheeze or overt sinus or hb symptoms.   Sleeping  without nocturnal  or early am exacerbation  of respiratory  c/o's or need for noct saba. Also denies any obvious fluctuation of symptoms with weather or environmental changes or other aggravating or alleviating factors except as outlined above   No unusual exposure hx or h/o childhood pna/ asthma or knowledge of premature birth.  Current Allergies, Complete Past Medical History, Past Surgical History, Family History, and Social History were reviewed in [facility] record.  ROS  The following are not active complaints unless bolded Hoarseness, sore throat, dysphagia, dental problems, itching, sneezing,  nasal congestion or discharge of excess mucus or purulent secretions, ear ache,   fever, chills, sweats, unintended wt loss or wt gain, classically pleuritic or exertional cp,  orthopnea pnd or arm/hand swelling  or leg swelling, presyncope, palpitations, abdominal pain, anorexia, nausea, vomiting, diarrhea  or change in bowel habits or change in bladder habits, change in stools or change in urine, dysuria, hematuria,  rash, arthralgias, visual complaints, headache, numbness, weakness or ataxia or problems with walking or coordination,  change in mood or  memory.        Current Meds  Medication Sig   Ascorbic Acid (VITAMIN C PO) Take 1 tablet by mouth daily.   VITAMIN D PO Take 1 tablet by mouth daily.            Objective:   Physical Exam    Wt Readings from Last 3 Encounters:  12/21/21 138 lb 3.2 oz (62.7 kg)  11/21/17 136 lb (61.7 kg)  06/19/15 136 lb (61.7 kg)      Vital signs reviewed  12/21/2021  - Note at rest 02 sats  100% on RA    General appearance:    amb wf nad occ throat clearing    HEENT : Oropharynx  nl   Nasal turbintes nl    NECK :  without  appent JVD/ palpable Nodes/TM    LUNGS: no acc muscle use,  Nl contour chest which is clear to A and P bilaterally without cough on insp or exp maneuvers   CV:  RRR  no s3 or murmur or increase in P2, and no edema   ABD:  soft and nontender with nl inspiratory excursion in the supine position. No bruits or organomegaly appreciated   MS:  Nl gait/ ext warm without deformities Or obvious joint restrictions  calf tenderness, cyanosis or clubbing     SKIN: warm and dry without lesions    NEURO:  alert, approp, nl sensorium with  no motor or cerebellar deficits apparent.          Assessment & Plan:

## 2021-12-21 NOTE — Patient Instructions (Signed)
Whenever cough flares  >   Try prilosec otc '20mg'$   Take 30-60 min before first meal of the day and Pepcid ac (famotidine) 20 mg one after supper until cough is completely gone for at least a week without the need for cough suppression    GERD (REFLUX)  is an extremely common cause of respiratory symptoms just like yours , many times with no obvious heartburn at all.    It can be treated with medication, but also with lifestyle changes including elevation of the head of your bed (ideally with 6 -8inch blocks under the headboard of your bed),  Smoking cessation, avoidance of late meals, excessive alcohol, and avoid fatty foods, chocolate, peppermint, colas, red wine, and acidic juices such as orange juice.  NO MINT OR MENTHOL PRODUCTS SO NO COUGH DROPS  USE SUGARLESS CANDY INSTEAD (Jolley ranchers or Stover's or Life Savers) or even ice chips will also do - the key is to swallow to prevent all throat clearing. NO OIL BASED VITAMINS - use powdered substitutes.  Avoid fish oil when coughing.   I will call in a year to make sure you get the follow up you need

## 2022-01-07 ENCOUNTER — Other Ambulatory Visit: Payer: Self-pay | Admitting: Obstetrics and Gynecology

## 2022-01-07 DIAGNOSIS — Z1231 Encounter for screening mammogram for malignant neoplasm of breast: Secondary | ICD-10-CM

## 2022-02-25 ENCOUNTER — Ambulatory Visit: Payer: Medicare Other

## 2022-03-01 ENCOUNTER — Ambulatory Visit
Admission: RE | Admit: 2022-03-01 | Discharge: 2022-03-01 | Disposition: A | Discharge status: Home / Self Care | Payer: Medicare Other | Source: Ambulatory Visit | Attending: Obstetrics and Gynecology | Admitting: Obstetrics and Gynecology

## 2022-03-01 DIAGNOSIS — Z6822 Body mass index (BMI) 22.0-22.9, adult: Secondary | ICD-10-CM | POA: Diagnosis not present

## 2022-03-01 DIAGNOSIS — Z1231 Encounter for screening mammogram for malignant neoplasm of breast: Secondary | ICD-10-CM

## 2022-03-01 DIAGNOSIS — Z124 Encounter for screening for malignant neoplasm of cervix: Secondary | ICD-10-CM | POA: Diagnosis not present

## 2022-03-01 DIAGNOSIS — Z1151 Encounter for screening for human papillomavirus (HPV): Secondary | ICD-10-CM | POA: Diagnosis not present

## 2022-05-24 DIAGNOSIS — D1801 Hemangioma of skin and subcutaneous tissue: Secondary | ICD-10-CM | POA: Diagnosis not present

## 2022-05-24 DIAGNOSIS — L821 Other seborrheic keratosis: Secondary | ICD-10-CM | POA: Diagnosis not present

## 2022-05-24 DIAGNOSIS — Z85828 Personal history of other malignant neoplasm of skin: Secondary | ICD-10-CM | POA: Diagnosis not present

## 2022-05-24 DIAGNOSIS — L738 Other specified follicular disorders: Secondary | ICD-10-CM | POA: Diagnosis not present

## 2022-05-24 DIAGNOSIS — L814 Other melanin hyperpigmentation: Secondary | ICD-10-CM | POA: Diagnosis not present

## 2022-05-24 DIAGNOSIS — D225 Melanocytic nevi of trunk: Secondary | ICD-10-CM | POA: Diagnosis not present

## 2022-05-24 DIAGNOSIS — L565 Disseminated superficial actinic porokeratosis (DSAP): Secondary | ICD-10-CM | POA: Diagnosis not present

## 2022-05-24 DIAGNOSIS — L820 Inflamed seborrheic keratosis: Secondary | ICD-10-CM | POA: Diagnosis not present

## 2022-05-24 DIAGNOSIS — L570 Actinic keratosis: Secondary | ICD-10-CM | POA: Diagnosis not present

## 2022-07-12 IMAGING — CT CT CARDIAC CORONARY ARTERY CALCIUM SCORE
3 series · 13 of 20 positions shown, 15 images · non-contrast
Comparison: None.

CLINICAL DATA: 71-year-old Caucasian female with family history of
heart disease.

EXAM:
CT CARDIAC CORONARY ARTERY CALCIUM SCORE
TECHNIQUE: Non-contrast imaging through the heart was performed using
prospective ECG gating. Image post processing was performed on an
independent workstation, allowing for quantitative analysis of the
heart and coronary arteries. Note that this exam targets the heart
and the chest was not imaged in its entirety.
RADIATION DOSE REDUCTION: This exam was performed according to the
departmental dose-optimization program which includes automated
exposure control, adjustment of the mA and/or kV according to
patient size and/or use of iterative reconstruction technique.

[Series 2: calcium scoring 2.00 qr36 bestdiast 70% hrt calciu · axial · 0.35mm/px · z∈[+1632,+1694]mm · 3 of 78 slices shown]
[im 16/78  vessel]
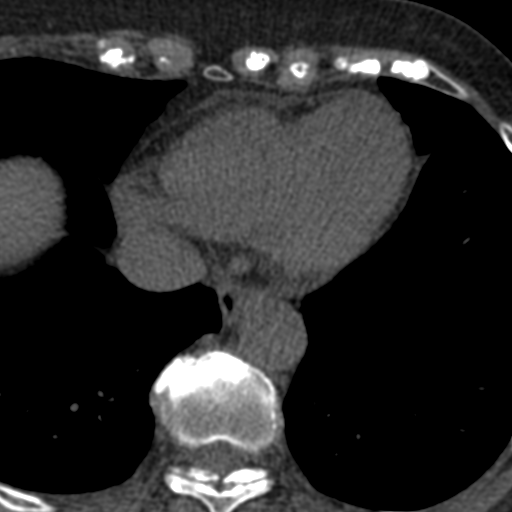
[im 31/78  vessel]
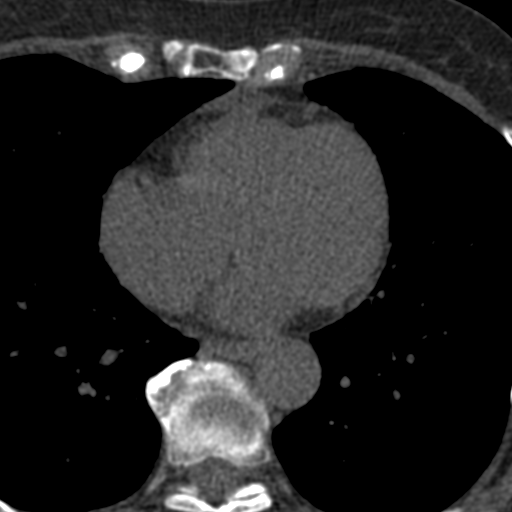
[im 47/78  vessel]
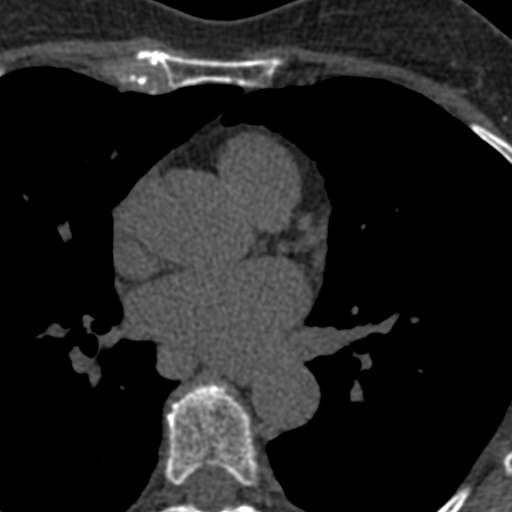

[Series 3: calcium scoring 2.00 br40 bestdiast 70% axial · axial · 0.49mm/px · z∈[+1622,+1728]mm · 5 of 81 slices shown, 7 images]
[im 14/81  vessel]
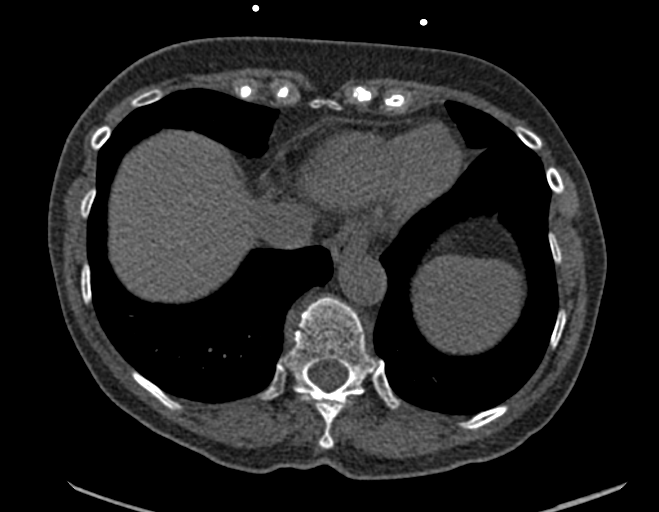
[im 14/81  lung]
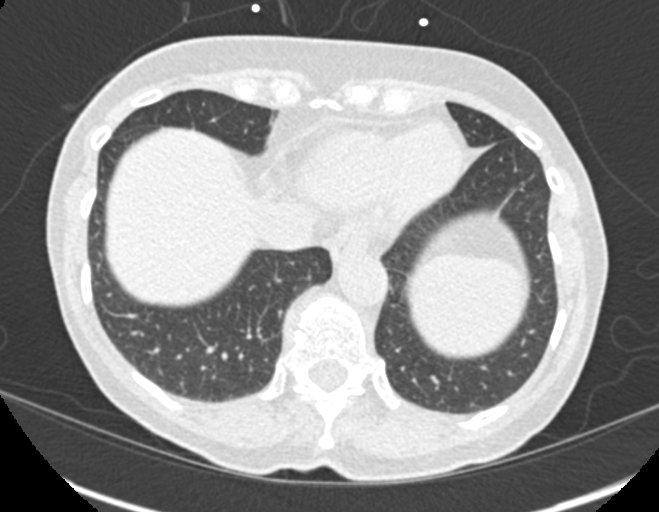
[im 27/81  vessel]
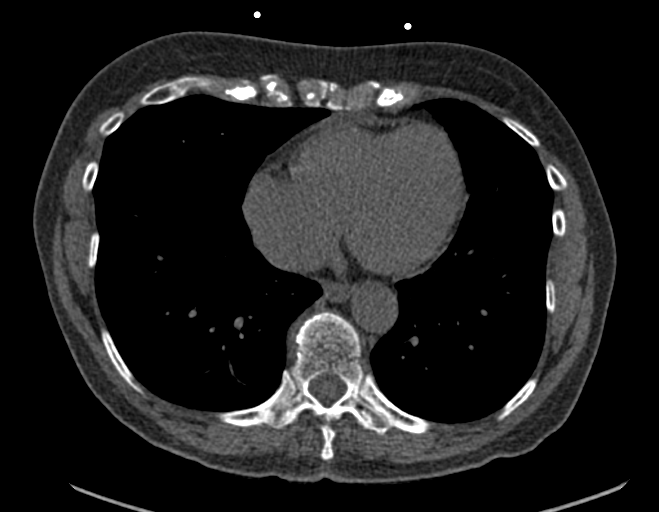
[im 41/81  vessel]
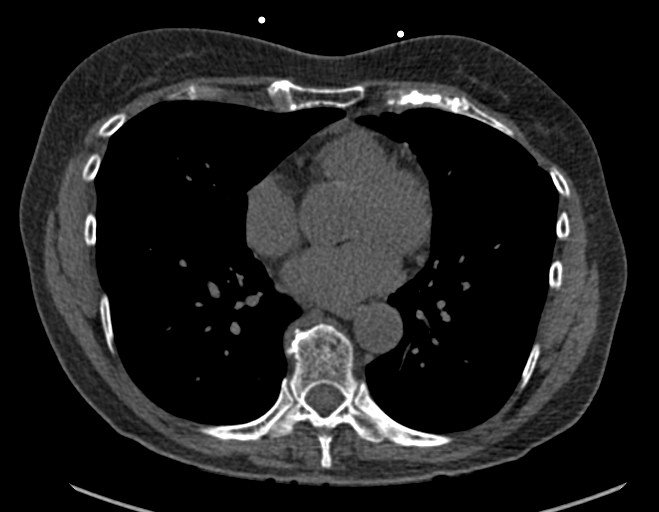
[im 54/81  vessel]
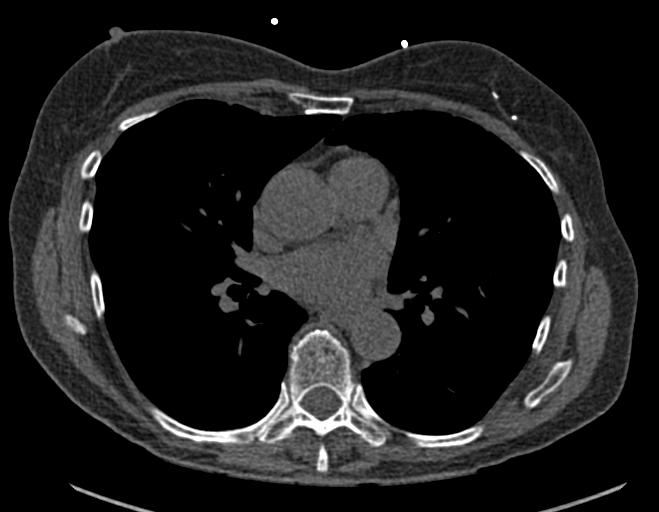
[im 67/81  vessel]
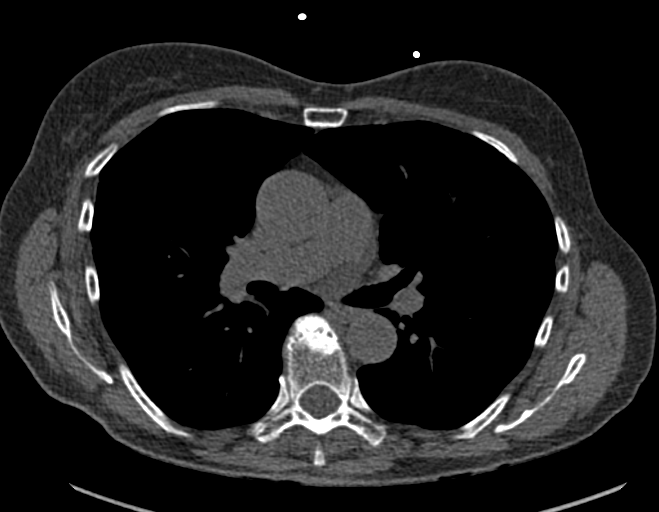
[im 67/81  lung]
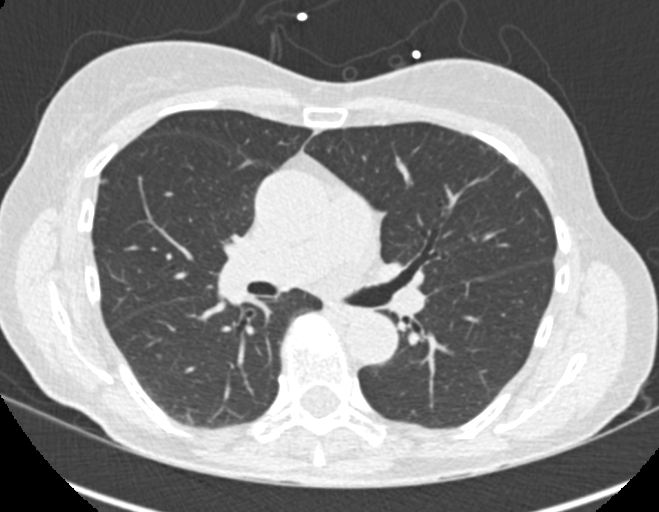

[Series 9: calcium scoring 2.00 br60 bestdiast 70% lungs · axial · 0.49mm/px · z∈[+1622,+1728]mm · 5 of 81 slices shown]
[im 14/81  vessel]
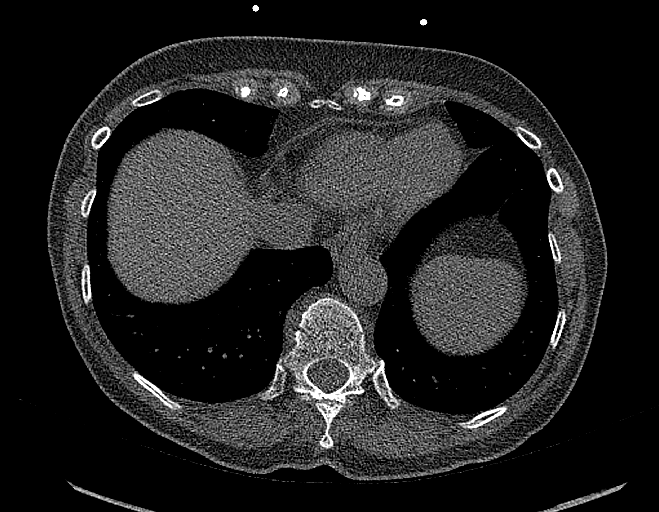
[im 27/81  vessel]
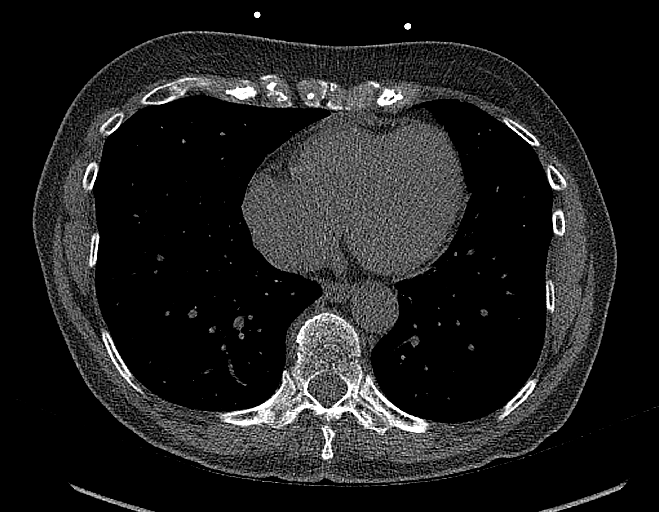
[im 41/81  vessel]
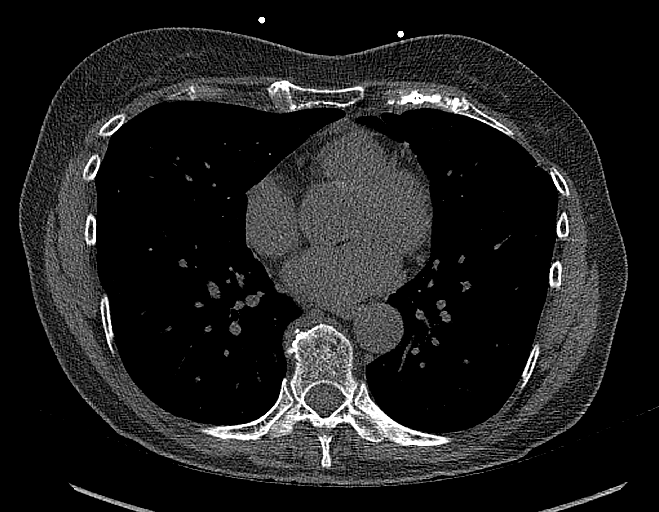
[im 54/81  vessel]
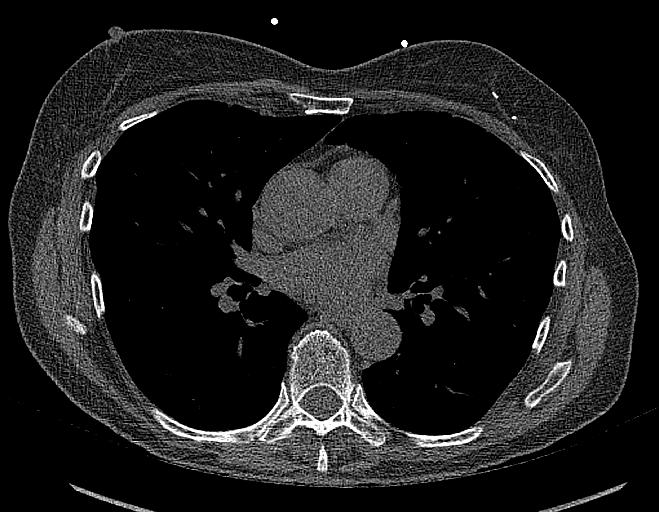
[im 67/81  vessel]
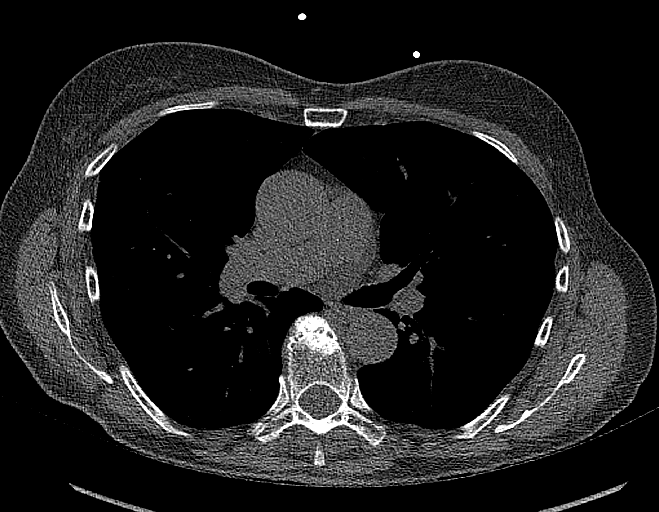

[13 of 20 positions shown; findings below may reference images not displayed]

FINDINGS: CORONARY CALCIUM SCORES:

Left Main: 0

LAD: 0

LCx: 0

RCA: 0

Total Agatston Score: 0

[HOSPITAL] percentile: 0

AORTA MEASUREMENTS:

Ascending Aorta: 34 mm

Descending Aorta: 24 mm

OTHER FINDINGS:

The heart size is within normal limits. No pericardial fluid is
identified. Focal calcification in the proximal thoracic aorta is
near the right coronary origin but is not definitely in the right
coronary artery. Visualized segments of the thoracic aorta and
central pulmonary arteries are normal in caliber. Visualized
mediastinum and hilar regions demonstrate no lymphadenopathy or
masses. There are a number of small subpleural nodules in both lungs
primarily at the level of the inferior right upper lobe, right
middle lobe, left upper lobe and lingula in an anterior and
anterolateral distribution. All of these nodules measure under 5 mm.
The largest single nodule is a 4.0 mm subpleural left upper lobe
nodule on image 9 and the largest right-sided nodule is a 3.0 mm
lateral subpleural right upper lobe nodule. Some of this nodularity
may relate to prior breast radiation therapy given distribution.
Infectious or inflammatory etiology is also possible. Visualized
lungs show no evidence of pulmonary edema, consolidation,
pneumothorax or pleural fluid. Visualized upper abdomen and bony
structures are unremarkable.
IMPRESSION: 1. Coronary calcium score of 0.
2. Multiple small subpleural nodules predominantly in an anterior
and anterolateral distribution. Some of these may relate to prior
breast radiation therapy.
Multiple pulmonary nodules. Most severe: 5 mm right solid pulmonary
nodule within the upper lobe. If patient is low risk for malignancy,
no routine follow-up imaging is recommended; if patient is high risk
for malignancy, a non-contrast Chest CT at 12 months is optional. If
performed and the nodule is stable at 12 months, no further
follow-up is recommended.
These guidelines do not apply to immunocompromised patients and
patients with cancer. Follow up in patients with significant
comorbidities as clinically warranted. For lung cancer screening,
adhere to Lung-RADS guidelines. Reference: Radiology. 0540;

## 2022-08-02 DIAGNOSIS — L814 Other melanin hyperpigmentation: Secondary | ICD-10-CM | POA: Diagnosis not present

## 2022-08-02 DIAGNOSIS — L820 Inflamed seborrheic keratosis: Secondary | ICD-10-CM | POA: Diagnosis not present

## 2022-08-02 DIAGNOSIS — D225 Melanocytic nevi of trunk: Secondary | ICD-10-CM | POA: Diagnosis not present

## 2022-08-02 DIAGNOSIS — Z85828 Personal history of other malignant neoplasm of skin: Secondary | ICD-10-CM | POA: Diagnosis not present

## 2022-08-02 DIAGNOSIS — D1801 Hemangioma of skin and subcutaneous tissue: Secondary | ICD-10-CM | POA: Diagnosis not present

## 2022-08-02 DIAGNOSIS — L821 Other seborrheic keratosis: Secondary | ICD-10-CM | POA: Diagnosis not present

## 2022-08-10 DIAGNOSIS — H9193 Unspecified hearing loss, bilateral: Secondary | ICD-10-CM | POA: Diagnosis not present

## 2022-08-10 DIAGNOSIS — G4700 Insomnia, unspecified: Secondary | ICD-10-CM | POA: Diagnosis not present

## 2022-08-10 DIAGNOSIS — E7800 Pure hypercholesterolemia, unspecified: Secondary | ICD-10-CM | POA: Diagnosis not present

## 2022-08-10 DIAGNOSIS — Z79899 Other long term (current) drug therapy: Secondary | ICD-10-CM | POA: Diagnosis not present

## 2022-08-10 DIAGNOSIS — Z853 Personal history of malignant neoplasm of breast: Secondary | ICD-10-CM | POA: Diagnosis not present

## 2022-08-10 DIAGNOSIS — R918 Other nonspecific abnormal finding of lung field: Secondary | ICD-10-CM | POA: Diagnosis not present

## 2022-08-10 DIAGNOSIS — Z1331 Encounter for screening for depression: Secondary | ICD-10-CM | POA: Diagnosis not present

## 2022-08-10 DIAGNOSIS — Z0000 Encounter for general adult medical examination without abnormal findings: Secondary | ICD-10-CM | POA: Diagnosis not present

## 2022-08-10 DIAGNOSIS — K219 Gastro-esophageal reflux disease without esophagitis: Secondary | ICD-10-CM | POA: Diagnosis not present

## 2022-10-14 ENCOUNTER — Encounter: Payer: Self-pay | Admitting: Internal Medicine

## 2022-10-14 ENCOUNTER — Ambulatory Visit: Payer: Medicare Other | Attending: Internal Medicine | Admitting: Internal Medicine

## 2022-10-14 VITALS — BP 118/88 | HR 74 | Ht 67.0 in | Wt 137.4 lb

## 2022-10-14 DIAGNOSIS — R002 Palpitations: Secondary | ICD-10-CM | POA: Diagnosis not present

## 2022-10-14 NOTE — Patient Instructions (Addendum)
Medication Instructions:  Your physician recommends that you continue on your current medications as directed. Please refer to the Current Medication list given to you today.  *If you need a refill on your cardiac medications before your next appointment, please call your pharmacy*  Lab Work: None ordered.  If you have labs (blood work) drawn today and your tests are completely normal, you will receive your results only by: MyChart Message (if you have MyChart) OR A paper copy in the mail If you have any lab test that is abnormal or we need to change your treatment, we will call you to review the results.  Testing/Procedures: None ordered.  Follow-Up: At [facility], you and your health needs are our priority.  As part of our continuing mission to provide you with exceptional heart care, we have created designated Provider Care Teams.  These Care Teams include your primary Cardiologist (physician) and Advanced Practice Providers (APPs -  Physician Assistants and Nurse Practitioners) who all work together to provide you with the care you need, when you need it.  We recommend signing up for the patient portal called "MyChart".  Sign up information is provided on this After Visit Summary.  MyChart is used to connect with patients for Virtual Visits (Telemedicine).  Patients are able to view lab/test results, encounter notes, upcoming appointments, etc.  Non-urgent messages can be sent to your provider as well.   To learn more about what you can do with MyChart, go to [email or web address].    Your next appointment:   AS NEEDED   The format for your next appointment:   In Person  Provider:   Gregg Taylor, MD{or one of the following Advanced Practice Providers on your designated Care Team:   Renee Ursuy, PA-C Michael "Andy" Tillery, PA-C        

## 2022-10-14 NOTE — Progress Notes (Signed)
HPI Mary Bartlett is referred by Dr. Daylene Katayama for evaluation of recurrent palpitations, most likely SVT. She is an otherwise healthy 72 yo woman with 4 episodes of palpitations dating back about 10 years. She most recently had 2 episodes over a couple of months. They start suddenly when she starts to exercise and last a few minutes. No ECG's. She denies syncope, chest pain or sob. She is a little lightheaded.  No Known Allergies   Current Outpatient Medications  Medication Sig Dispense Refill   Ascorbic Acid (VITAMIN C PO) Take 1 tablet by mouth daily.     VITAMIN D PO Take 1 tablet by mouth daily.     No current facility-administered medications for this visit.     Past Medical History:  Diagnosis Date   Breast cancer ([place]) 2012   left   Cancer [facility]) 2012   breast- left   No pertinent past medical history    Personal history of radiation therapy     ROS:   All systems reviewed and negative except as noted in the HPI.   Past Surgical History:  Procedure Laterality Date   BREAST BIOPSY Left 11/18/2010   BREAST LUMPECTOMY Left 12/01/2010   BREAST SURGERY  5/12   left mastectomy-partial   CHOLECYSTECTOMY  1987   KNEE ARTHROSCOPY WITH ANTERIOR CRUCIATE LIGAMENT (ACL) REPAIR  05/18/2012   Procedure: KNEE ARTHROSCOPY WITH ANTERIOR CRUCIATE LIGAMENT (ACL) REPAIR;  Surgeon: Ninetta Lights, MD;  Location: [facility];  Service: Orthopedics;  Laterality: Right;  RIGHT KNEE ARTHROSCOPY AND ACL RECONSTRUCTION AND MICROFRACTURE, Debridement Lateral Meniscus   ROTATOR CUFF REPAIR  2009/1992   BILATERAL     Family History  Problem Relation Age of Onset   Stroke Father    Mesothelioma Brother      Social History   Socioeconomic History   Marital status: Married    Spouse name: Not on file   Number of children: Not on file   Years of education: Not on file   Highest education level: Not on file  Occupational History   Not on file  Tobacco Use   Smoking  status: Never   Smokeless tobacco: Not on file  Vaping Use   Vaping Use: Never used  Substance and Sexual Activity   Alcohol use: Yes    Comment: WINE OCCASIONALLY   Drug use: No   Sexual activity: Not on file  Other Topics Concern   Not on file  Social History Narrative   Not on file   Social Determinants of Health   Financial Resource Strain: Not on file  Food Insecurity: Not on file  Transportation Needs: Not on file  Physical Activity: Not on file  Stress: Not on file  Social Connections: Not on file  Intimate Partner Violence: Not on file     BP 118/88   Pulse 74   Ht 5\' 7"  (1.702 m)   Wt 137 lb 6.4 oz (62.3 kg)   SpO2 98%   BMI 21.52 kg/m   Physical Exam:  Well appearing 72 yo woman, looks younger than stated age, NAD HEENT: Unremarkable Neck:  6 cm JVD, no thyromegally Lymphatics:  No adenopathy Back:  No CVA tenderness Lungs:  Clear with no wheezes HEART:  Regular rate rhythm, no murmurs, no rubs, no clicks Abd:  soft, positive bowel sounds, no organomegally, no rebound, no guarding Ext:  2 plus pulses, no edema, no cyanosis, no clubbing Skin:  No rashes no nodules  Neuro:  CN II through XII intact, motor grossly intact  EKG - nsr  Assess/Plan: Palpitations - probable SVT but self limited. We discussed the treatment options. I recommended watchful waiting. If her symptoms increase in frequency or severity, then I would consider a monitor, or try to catch on an Apple watch or Kardia mobile app. I asked her to moderate caffeine and ETOH, limiting each to no more than a single drink a day.   Carleene Overlie Quentez Lober,MD

## 2022-10-19 DIAGNOSIS — H524 Presbyopia: Secondary | ICD-10-CM | POA: Diagnosis not present

## 2022-10-19 DIAGNOSIS — H2513 Age-related nuclear cataract, bilateral: Secondary | ICD-10-CM | POA: Diagnosis not present

## 2022-10-21 DIAGNOSIS — H903 Sensorineural hearing loss, bilateral: Secondary | ICD-10-CM | POA: Diagnosis not present

## 2022-10-21 DIAGNOSIS — H838X3 Other specified diseases of inner ear, bilateral: Secondary | ICD-10-CM | POA: Diagnosis not present

## 2022-10-29 DIAGNOSIS — H43391 Other vitreous opacities, right eye: Secondary | ICD-10-CM | POA: Diagnosis not present

## 2022-10-29 DIAGNOSIS — H43811 Vitreous degeneration, right eye: Secondary | ICD-10-CM | POA: Diagnosis not present

## 2022-11-04 ENCOUNTER — Other Ambulatory Visit: Payer: Self-pay | Admitting: Internal Medicine

## 2022-11-04 DIAGNOSIS — R918 Other nonspecific abnormal finding of lung field: Secondary | ICD-10-CM

## 2022-11-15 ENCOUNTER — Telehealth: Payer: Self-pay | Admitting: Internal Medicine

## 2022-11-15 NOTE — Telephone Encounter (Signed)
Pt has bronchitis and is scheduled for a CT scan and wants to know should she just do a xray

## 2022-11-17 ENCOUNTER — Ambulatory Visit (HOSPITAL_COMMUNITY)
Admission: RE | Admit: 2022-11-17 | Discharge: 2022-11-17 | Disposition: A | Discharge status: Home / Self Care | Payer: Medicare Other | Source: Ambulatory Visit | Attending: Internal Medicine | Admitting: Internal Medicine

## 2022-11-17 ENCOUNTER — Encounter (HOSPITAL_COMMUNITY): Payer: Self-pay

## 2022-11-17 DIAGNOSIS — R918 Other nonspecific abnormal finding of lung field: Secondary | ICD-10-CM | POA: Diagnosis not present

## 2022-11-17 DIAGNOSIS — I700 Atherosclerosis of aorta: Secondary | ICD-10-CM | POA: Diagnosis not present

## 2022-11-17 NOTE — Telephone Encounter (Signed)
Called and spoke with patient, she states she went ahead and had the CT scan done.  She had a chest cold back on 4/20, she had some green mucous.  She ran no fever and her covid test was negative.  She says she feels like she is on the tail end of the cold and is feeling much better.   I let her know that we will call her with the results of her CT scan.  I advised her to call us back if she starts coughing up any colored mucous or runs a fever.  She verbalized understanding.  Nothing further needed.

## 2022-11-25 NOTE — Progress Notes (Signed)
Tried calling pt and there was no answer- LMTCB.  

## 2022-12-08 DIAGNOSIS — H43811 Vitreous degeneration, right eye: Secondary | ICD-10-CM | POA: Diagnosis not present

## 2022-12-08 DIAGNOSIS — H43391 Other vitreous opacities, right eye: Secondary | ICD-10-CM | POA: Diagnosis not present

## 2022-12-24 NOTE — Progress Notes (Signed)
Spoke with pt and notified of results per Dr. Wert. Pt verbalized understanding and denied any questions. 

## 2023-01-28 ENCOUNTER — Other Ambulatory Visit: Payer: Self-pay | Admitting: Internal Medicine

## 2023-01-28 DIAGNOSIS — Z1231 Encounter for screening mammogram for malignant neoplasm of breast: Secondary | ICD-10-CM

## 2023-02-21 DIAGNOSIS — H903 Sensorineural hearing loss, bilateral: Secondary | ICD-10-CM | POA: Diagnosis not present

## 2023-02-21 DIAGNOSIS — H832X1 Labyrinthine dysfunction, right ear: Secondary | ICD-10-CM | POA: Diagnosis not present

## 2023-02-21 DIAGNOSIS — H838X3 Other specified diseases of inner ear, bilateral: Secondary | ICD-10-CM | POA: Diagnosis not present

## 2023-02-21 DIAGNOSIS — R42 Dizziness and giddiness: Secondary | ICD-10-CM | POA: Diagnosis not present

## 2023-03-08 ENCOUNTER — Ambulatory Visit: Admission: RE | Admit: 2023-03-08 | Payer: Medicare Other | Source: Ambulatory Visit

## 2023-03-08 DIAGNOSIS — Z1231 Encounter for screening mammogram for malignant neoplasm of breast: Secondary | ICD-10-CM | POA: Diagnosis not present

## 2023-03-22 DIAGNOSIS — Z6821 Body mass index (BMI) 21.0-21.9, adult: Secondary | ICD-10-CM | POA: Diagnosis not present

## 2023-03-22 DIAGNOSIS — Z01419 Encounter for gynecological examination (general) (routine) without abnormal findings: Secondary | ICD-10-CM | POA: Diagnosis not present

## 2023-04-06 DIAGNOSIS — H2513 Age-related nuclear cataract, bilateral: Secondary | ICD-10-CM | POA: Diagnosis not present

## 2023-04-06 DIAGNOSIS — H5203 Hypermetropia, bilateral: Secondary | ICD-10-CM | POA: Diagnosis not present

## 2023-04-20 DIAGNOSIS — M545 Low back pain, unspecified: Secondary | ICD-10-CM | POA: Diagnosis not present

## 2023-04-20 DIAGNOSIS — M25551 Pain in right hip: Secondary | ICD-10-CM | POA: Diagnosis not present

## 2023-04-28 DIAGNOSIS — M7601 Gluteal tendinitis, right hip: Secondary | ICD-10-CM | POA: Diagnosis not present

## 2023-04-28 DIAGNOSIS — M6289 Other specified disorders of muscle: Secondary | ICD-10-CM | POA: Diagnosis not present

## 2023-04-28 DIAGNOSIS — M25551 Pain in right hip: Secondary | ICD-10-CM | POA: Diagnosis not present

## 2023-05-09 DIAGNOSIS — M6289 Other specified disorders of muscle: Secondary | ICD-10-CM | POA: Diagnosis not present

## 2023-05-09 DIAGNOSIS — M7601 Gluteal tendinitis, right hip: Secondary | ICD-10-CM | POA: Diagnosis not present

## 2023-05-09 DIAGNOSIS — M25551 Pain in right hip: Secondary | ICD-10-CM | POA: Diagnosis not present

## 2023-05-12 ENCOUNTER — Ambulatory Visit (INDEPENDENT_AMBULATORY_CARE_PROVIDER_SITE_OTHER): Payer: Medicare Other | Admitting: Audiology

## 2023-05-12 ENCOUNTER — Ambulatory Visit (INDEPENDENT_AMBULATORY_CARE_PROVIDER_SITE_OTHER): Payer: Medicare Other | Admitting: Otolaryngology

## 2023-05-12 ENCOUNTER — Encounter (INDEPENDENT_AMBULATORY_CARE_PROVIDER_SITE_OTHER): Payer: Self-pay

## 2023-05-12 VITALS — Ht 66.0 in | Wt 137.0 lb

## 2023-05-12 DIAGNOSIS — H903 Sensorineural hearing loss, bilateral: Secondary | ICD-10-CM

## 2023-05-12 DIAGNOSIS — R42 Dizziness and giddiness: Secondary | ICD-10-CM

## 2023-05-12 NOTE — Progress Notes (Signed)
[address], [address] [place], [place] [address] [phone]  Audiological Evaluation    Name: Mary Bartlett     DOB:   1950/08/28      MRN:   [id number]                                                                                     Service Date: 05/12/2023        Patient was referred today for a hearing evaluation by Dr. Karle Barr.  Symptoms Yes Details  Hearing loss  [x]   Patient reported experiencing some hearing loss, primarily in her left ear.  Tinnitus  [x]   Patient reported intermittent tinnitus in both ears.  Balance problems  []   Patient denied vertigo sensations.  Previous ear surgeries  []   Patient denied any previous ear surgeries.  Amplification  []   Patient denied the use of hearing aids.    Tympanogram: Right ear: Normal external ear canal volume with normal middle ear pressure and tympanic membrane compliance (Type A). Left ear: Normal external ear canal volume with normal middle ear pressure and tympanic membrane compliance (Type A).    Hearing Evaluation: The audiogram was completed using conventional audiometric techniques under headphones with good reliability.   The hearing test results indicate: Right ear: Normal hearing sensitivity from [phone] Hz sloping to severe sensorineural hearing loss from 3000-8000 Hz. Left ear: Normal hearing sensitivity from [phone] Hz sloping to severe sensorineural hearing loss from 3000-8000 Hz.  Speech Audiometry: Right ear- Speech Reception Threshold (SRT) was obtained at 20 dBHL. Left ear- Speech Reception Threshold (SRT) was obtained at 25 dBHL masked.   Word Recognition Score Tested using NU-6 (MLV) Right ear: 100% was obtained at a presentation level of 60 dBHL which is deemed as excellent understanding. Left ear: 100% was obtained at a presentation level of 65 dBHL with contralateral masking which is deemed as excellent understanding.    Impression:  There is not a significant difference between  word recognition scores between ears.   Recommendations: Repeat audiogram when changes are perceived or per MD. Patient is a candidate for hearing amplification, consider a hearing aid evaluation.   Conley Rolls Jakki Doughty, AUD, CCC-A 05/12/23

## 2023-05-15 DIAGNOSIS — H903 Sensorineural hearing loss, bilateral: Secondary | ICD-10-CM | POA: Insufficient documentation

## 2023-05-15 DIAGNOSIS — R42 Dizziness and giddiness: Secondary | ICD-10-CM | POA: Insufficient documentation

## 2023-05-15 NOTE — Progress Notes (Unsigned)
Patient ID: Mary Bartlett, female   DOB: Jun 09, 1951, 72 y.o.   MRN: [id number]  Follow-up: Recurrent dizziness, hearing loss  HPI: The patient is a 72 year old female who returns today for her follow-up evaluation.  The patient was previously seen for recurrent dizziness and progressive hearing loss.  She was previously noted to have bilateral high-frequency sensorineural hearing loss, worse on the left side at low frequencies.  The small possibility of a retrocochlear lesion was discussed.  The patient elected to proceed with conservative observation.  The patient was also seen for recurrent dizziness.  Her history was suggestive of transient right benign paroxysmal positional vertigo.  The patient returns today reporting normal vertigo since the last visit.  She still has occasional hearing difficulty, especially in noisy environments.  She has not noted any significant change in her hearing recently.  Exam: General: Communicates without difficulty, well nourished, no acute distress. Head: Normocephalic, no evidence injury, no tenderness, facial buttresses intact without stepoff. Face/sinus: No tenderness to palpation and percussion. Facial movement is normal and symmetric. Eyes: PERRL, EOMI. No scleral icterus, conjunctivae clear. Neuro: CN II exam reveals vision grossly intact.  No nystagmus at any point of gaze. Ears: Auricles well formed without lesions.  Ear canals are intact without mass or lesion.  No erythema or edema is appreciated.  The TMs are intact without fluid. Nose: External evaluation reveals normal support and skin without lesions.  Dorsum is intact.  Anterior rhinoscopy reveals pink mucosa over anterior aspect of inferior turbinates and intact septum.  No purulence noted. Oral:  Oral cavity and oropharynx are intact, symmetric, without erythema or edema.  Mucosa is moist without lesions. Neck: Full range of motion without pain.  There is no significant lymphadenopathy.  No masses  palpable.  Thyroid bed within normal limits to palpation.  Parotid glands and submandibular glands equal bilaterally without mass.  Trachea is midline. Neuro:  CN 2-12 grossly intact. Gait normal.   AUDIOMETRIC TESTING: I have read and reviewed the audiometric test, which shows bilateral high-frequency sensorineural hearing loss. The speech reception threshold is 20dB AD and 25dB AS. The discrimination score is 100% AD and 100% AS. The tympanogram is normal bilaterally.   assessment 1.  Stable bilateral high-frequency sensorineural hearing loss, slightly worse on the left side at low frequencies. 2.  The patient's ear canals, tympanic membranes, and middle ear spaces are normal.  3.  The patient's BPPV has not recurred.  plan 1.  The physical exam findings and the hearing test results are reviewed with the patient.  2.  The small possibility of a retrocochlear lesion causing her asymmetric hearing loss is discussed.  The options of conservative observation versus MRI scan are reviewed.  The patient would like to proceed with conservative observation for now.  3.  The patient is a candidate for hearing amplification.  The hearing aid options are discussed.  4.  The patient will return for reevaluation in 1 year.

## 2023-05-25 DIAGNOSIS — L570 Actinic keratosis: Secondary | ICD-10-CM | POA: Diagnosis not present

## 2023-05-25 DIAGNOSIS — D485 Neoplasm of uncertain behavior of skin: Secondary | ICD-10-CM | POA: Diagnosis not present

## 2023-05-25 DIAGNOSIS — C44729 Squamous cell carcinoma of skin of left lower limb, including hip: Secondary | ICD-10-CM | POA: Diagnosis not present

## 2023-05-25 DIAGNOSIS — L820 Inflamed seborrheic keratosis: Secondary | ICD-10-CM | POA: Diagnosis not present

## 2023-05-25 DIAGNOSIS — M6289 Other specified disorders of muscle: Secondary | ICD-10-CM | POA: Diagnosis not present

## 2023-05-25 DIAGNOSIS — M7601 Gluteal tendinitis, right hip: Secondary | ICD-10-CM | POA: Diagnosis not present

## 2023-05-25 DIAGNOSIS — D225 Melanocytic nevi of trunk: Secondary | ICD-10-CM | POA: Diagnosis not present

## 2023-05-25 DIAGNOSIS — L821 Other seborrheic keratosis: Secondary | ICD-10-CM | POA: Diagnosis not present

## 2023-05-25 DIAGNOSIS — M25551 Pain in right hip: Secondary | ICD-10-CM | POA: Diagnosis not present

## 2023-05-25 DIAGNOSIS — L814 Other melanin hyperpigmentation: Secondary | ICD-10-CM | POA: Diagnosis not present

## 2023-05-25 DIAGNOSIS — Z85828 Personal history of other malignant neoplasm of skin: Secondary | ICD-10-CM | POA: Diagnosis not present

## 2023-05-26 DIAGNOSIS — K5901 Slow transit constipation: Secondary | ICD-10-CM | POA: Diagnosis not present

## 2023-05-26 DIAGNOSIS — M7918 Myalgia, other site: Secondary | ICD-10-CM | POA: Diagnosis not present

## 2023-06-01 DIAGNOSIS — M25551 Pain in right hip: Secondary | ICD-10-CM | POA: Diagnosis not present

## 2023-06-01 DIAGNOSIS — M7601 Gluteal tendinitis, right hip: Secondary | ICD-10-CM | POA: Diagnosis not present

## 2023-06-01 DIAGNOSIS — M6289 Other specified disorders of muscle: Secondary | ICD-10-CM | POA: Diagnosis not present

## 2023-06-24 DIAGNOSIS — K5901 Slow transit constipation: Secondary | ICD-10-CM | POA: Diagnosis not present

## 2023-07-01 DIAGNOSIS — L820 Inflamed seborrheic keratosis: Secondary | ICD-10-CM | POA: Diagnosis not present

## 2023-07-01 DIAGNOSIS — L720 Epidermal cyst: Secondary | ICD-10-CM | POA: Diagnosis not present

## 2023-07-01 DIAGNOSIS — Z85828 Personal history of other malignant neoplasm of skin: Secondary | ICD-10-CM | POA: Diagnosis not present

## 2023-07-01 DIAGNOSIS — L570 Actinic keratosis: Secondary | ICD-10-CM | POA: Diagnosis not present

## 2023-08-16 DIAGNOSIS — E7800 Pure hypercholesterolemia, unspecified: Secondary | ICD-10-CM | POA: Diagnosis not present

## 2023-08-16 DIAGNOSIS — G4700 Insomnia, unspecified: Secondary | ICD-10-CM | POA: Diagnosis not present

## 2023-08-16 DIAGNOSIS — R918 Other nonspecific abnormal finding of lung field: Secondary | ICD-10-CM | POA: Diagnosis not present

## 2023-08-16 DIAGNOSIS — K5901 Slow transit constipation: Secondary | ICD-10-CM | POA: Diagnosis not present

## 2023-08-16 DIAGNOSIS — Z79899 Other long term (current) drug therapy: Secondary | ICD-10-CM | POA: Diagnosis not present

## 2023-08-16 DIAGNOSIS — K219 Gastro-esophageal reflux disease without esophagitis: Secondary | ICD-10-CM | POA: Diagnosis not present

## 2023-08-16 DIAGNOSIS — Z0000 Encounter for general adult medical examination without abnormal findings: Secondary | ICD-10-CM | POA: Diagnosis not present

## 2023-08-16 DIAGNOSIS — E559 Vitamin D deficiency, unspecified: Secondary | ICD-10-CM | POA: Diagnosis not present

## 2023-08-16 DIAGNOSIS — M7918 Myalgia, other site: Secondary | ICD-10-CM | POA: Diagnosis not present

## 2023-08-16 DIAGNOSIS — M8580 Other specified disorders of bone density and structure, unspecified site: Secondary | ICD-10-CM | POA: Diagnosis not present

## 2023-08-16 DIAGNOSIS — F1090 Alcohol use, unspecified, uncomplicated: Secondary | ICD-10-CM | POA: Diagnosis not present

## 2023-08-16 DIAGNOSIS — Z853 Personal history of malignant neoplasm of breast: Secondary | ICD-10-CM | POA: Diagnosis not present

## 2023-10-11 DIAGNOSIS — H2513 Age-related nuclear cataract, bilateral: Secondary | ICD-10-CM | POA: Diagnosis not present

## 2023-10-11 DIAGNOSIS — H43811 Vitreous degeneration, right eye: Secondary | ICD-10-CM | POA: Diagnosis not present

## 2023-10-17 DIAGNOSIS — E7800 Pure hypercholesterolemia, unspecified: Secondary | ICD-10-CM | POA: Diagnosis not present

## 2023-10-17 DIAGNOSIS — Z853 Personal history of malignant neoplasm of breast: Secondary | ICD-10-CM | POA: Diagnosis not present

## 2023-10-24 DIAGNOSIS — L720 Epidermal cyst: Secondary | ICD-10-CM | POA: Diagnosis not present

## 2023-10-24 DIAGNOSIS — Z85828 Personal history of other malignant neoplasm of skin: Secondary | ICD-10-CM | POA: Diagnosis not present

## 2023-10-24 DIAGNOSIS — L570 Actinic keratosis: Secondary | ICD-10-CM | POA: Diagnosis not present

## 2023-10-24 DIAGNOSIS — L820 Inflamed seborrheic keratosis: Secondary | ICD-10-CM | POA: Diagnosis not present

## 2023-10-24 DIAGNOSIS — D1801 Hemangioma of skin and subcutaneous tissue: Secondary | ICD-10-CM | POA: Diagnosis not present

## 2023-11-16 DIAGNOSIS — E7800 Pure hypercholesterolemia, unspecified: Secondary | ICD-10-CM | POA: Diagnosis not present

## 2023-11-16 DIAGNOSIS — Z853 Personal history of malignant neoplasm of breast: Secondary | ICD-10-CM | POA: Diagnosis not present

## 2023-12-17 DIAGNOSIS — Z853 Personal history of malignant neoplasm of breast: Secondary | ICD-10-CM | POA: Diagnosis not present

## 2023-12-17 DIAGNOSIS — E7800 Pure hypercholesterolemia, unspecified: Secondary | ICD-10-CM | POA: Diagnosis not present

## 2024-02-06 ENCOUNTER — Other Ambulatory Visit: Payer: Self-pay | Admitting: Internal Medicine

## 2024-02-06 DIAGNOSIS — Z1231 Encounter for screening mammogram for malignant neoplasm of breast: Secondary | ICD-10-CM

## 2024-03-07 DIAGNOSIS — I8390 Asymptomatic varicose veins of unspecified lower extremity: Secondary | ICD-10-CM | POA: Diagnosis not present

## 2024-03-08 ENCOUNTER — Ambulatory Visit
Admission: RE | Admit: 2024-03-08 | Discharge: 2024-03-08 | Disposition: A | Discharge status: Home / Self Care | Source: Ambulatory Visit | Attending: Internal Medicine | Admitting: Internal Medicine

## 2024-03-08 DIAGNOSIS — Z1231 Encounter for screening mammogram for malignant neoplasm of breast: Secondary | ICD-10-CM

## 2024-03-09 ENCOUNTER — Other Ambulatory Visit: Payer: Self-pay

## 2024-03-09 DIAGNOSIS — I8392 Asymptomatic varicose veins of left lower extremity: Secondary | ICD-10-CM

## 2024-03-12 DIAGNOSIS — S46011A Strain of muscle(s) and tendon(s) of the rotator cuff of right shoulder, initial encounter: Secondary | ICD-10-CM | POA: Diagnosis not present

## 2024-04-05 ENCOUNTER — Encounter (HOSPITAL_COMMUNITY)

## 2024-04-09 DIAGNOSIS — H35371 Puckering of macula, right eye: Secondary | ICD-10-CM | POA: Diagnosis not present

## 2024-04-09 DIAGNOSIS — H2513 Age-related nuclear cataract, bilateral: Secondary | ICD-10-CM | POA: Diagnosis not present

## 2024-04-11 ENCOUNTER — Encounter (HOSPITAL_COMMUNITY)

## 2024-04-16 ENCOUNTER — Other Ambulatory Visit: Payer: Self-pay | Admitting: Orthopedic Surgery

## 2024-04-16 DIAGNOSIS — M85811 Other specified disorders of bone density and structure, right shoulder: Secondary | ICD-10-CM

## 2024-04-20 ENCOUNTER — Ambulatory Visit
Admission: RE | Admit: 2024-04-20 | Discharge: 2024-04-20 | Disposition: A | Discharge status: Home / Self Care | Source: Ambulatory Visit | Attending: Orthopedic Surgery | Admitting: Orthopedic Surgery

## 2024-04-20 DIAGNOSIS — M19011 Primary osteoarthritis, right shoulder: Secondary | ICD-10-CM | POA: Diagnosis not present

## 2024-04-20 DIAGNOSIS — M85811 Other specified disorders of bone density and structure, right shoulder: Secondary | ICD-10-CM

## 2024-04-26 ENCOUNTER — Encounter

## 2024-05-03 DIAGNOSIS — H00012 Hordeolum externum right lower eyelid: Secondary | ICD-10-CM | POA: Diagnosis not present

## 2024-05-03 DIAGNOSIS — H10501 Unspecified blepharoconjunctivitis, right eye: Secondary | ICD-10-CM | POA: Diagnosis not present

## 2024-05-10 DIAGNOSIS — M75101 Unspecified rotator cuff tear or rupture of right shoulder, not specified as traumatic: Secondary | ICD-10-CM | POA: Diagnosis not present

## 2024-05-10 DIAGNOSIS — M19211 Secondary osteoarthritis, right shoulder: Secondary | ICD-10-CM | POA: Diagnosis not present

## 2024-05-10 DIAGNOSIS — M12811 Other specific arthropathies, not elsewhere classified, right shoulder: Secondary | ICD-10-CM | POA: Diagnosis not present

## 2024-05-10 DIAGNOSIS — Z96611 Presence of right artificial shoulder joint: Secondary | ICD-10-CM | POA: Diagnosis not present

## 2024-05-10 DIAGNOSIS — M75121 Complete rotator cuff tear or rupture of right shoulder, not specified as traumatic: Secondary | ICD-10-CM | POA: Diagnosis not present

## 2024-05-11 DIAGNOSIS — Z6821 Body mass index (BMI) 21.0-21.9, adult: Secondary | ICD-10-CM | POA: Diagnosis not present

## 2024-05-11 DIAGNOSIS — Z01419 Encounter for gynecological examination (general) (routine) without abnormal findings: Secondary | ICD-10-CM | POA: Diagnosis not present

## 2024-05-22 DIAGNOSIS — H2511 Age-related nuclear cataract, right eye: Secondary | ICD-10-CM | POA: Diagnosis not present

## 2024-05-22 DIAGNOSIS — Z961 Presence of intraocular lens: Secondary | ICD-10-CM | POA: Diagnosis not present

## 2024-05-22 DIAGNOSIS — H25811 Combined forms of age-related cataract, right eye: Secondary | ICD-10-CM | POA: Diagnosis not present

## 2024-05-24 ENCOUNTER — Encounter (HOSPITAL_BASED_OUTPATIENT_CLINIC_OR_DEPARTMENT_OTHER): Payer: Self-pay

## 2024-05-24 DIAGNOSIS — Z85828 Personal history of other malignant neoplasm of skin: Secondary | ICD-10-CM | POA: Diagnosis not present

## 2024-05-24 DIAGNOSIS — L820 Inflamed seborrheic keratosis: Secondary | ICD-10-CM | POA: Diagnosis not present

## 2024-05-24 DIAGNOSIS — L814 Other melanin hyperpigmentation: Secondary | ICD-10-CM | POA: Diagnosis not present

## 2024-05-24 DIAGNOSIS — D225 Melanocytic nevi of trunk: Secondary | ICD-10-CM | POA: Diagnosis not present

## 2024-05-24 DIAGNOSIS — L821 Other seborrheic keratosis: Secondary | ICD-10-CM | POA: Diagnosis not present

## 2024-05-24 DIAGNOSIS — L570 Actinic keratosis: Secondary | ICD-10-CM | POA: Diagnosis not present

## 2024-05-24 DIAGNOSIS — D2261 Melanocytic nevi of right upper limb, including shoulder: Secondary | ICD-10-CM | POA: Diagnosis not present

## 2024-05-25 DIAGNOSIS — M8588 Other specified disorders of bone density and structure, other site: Secondary | ICD-10-CM | POA: Diagnosis not present

## 2024-05-25 DIAGNOSIS — N958 Other specified menopausal and perimenopausal disorders: Secondary | ICD-10-CM | POA: Diagnosis not present

## 2024-06-05 DIAGNOSIS — H25812 Combined forms of age-related cataract, left eye: Secondary | ICD-10-CM | POA: Diagnosis not present

## 2024-06-05 DIAGNOSIS — Z961 Presence of intraocular lens: Secondary | ICD-10-CM | POA: Diagnosis not present

## 2024-06-05 DIAGNOSIS — H2512 Age-related nuclear cataract, left eye: Secondary | ICD-10-CM | POA: Diagnosis not present

## 2024-06-20 DIAGNOSIS — G8918 Other acute postprocedural pain: Secondary | ICD-10-CM | POA: Diagnosis not present

## 2024-06-20 DIAGNOSIS — M25711 Osteophyte, right shoulder: Secondary | ICD-10-CM | POA: Diagnosis not present

## 2024-06-20 DIAGNOSIS — X58XXXA Exposure to other specified factors, initial encounter: Secondary | ICD-10-CM | POA: Diagnosis not present

## 2024-06-20 DIAGNOSIS — M12811 Other specific arthropathies, not elsewhere classified, right shoulder: Secondary | ICD-10-CM | POA: Diagnosis not present

## 2024-06-20 DIAGNOSIS — S46211A Strain of muscle, fascia and tendon of other parts of biceps, right arm, initial encounter: Secondary | ICD-10-CM | POA: Diagnosis not present

## 2024-06-20 DIAGNOSIS — M75121 Complete rotator cuff tear or rupture of right shoulder, not specified as traumatic: Secondary | ICD-10-CM | POA: Diagnosis not present

## 2024-06-20 DIAGNOSIS — Y999 Unspecified external cause status: Secondary | ICD-10-CM | POA: Diagnosis not present

## 2024-06-20 DIAGNOSIS — M75101 Unspecified rotator cuff tear or rupture of right shoulder, not specified as traumatic: Secondary | ICD-10-CM | POA: Diagnosis not present

## 2024-06-22 DIAGNOSIS — Z96611 Presence of right artificial shoulder joint: Secondary | ICD-10-CM | POA: Diagnosis not present

## 2024-06-22 DIAGNOSIS — M25611 Stiffness of right shoulder, not elsewhere classified: Secondary | ICD-10-CM | POA: Diagnosis not present

## 2024-06-22 DIAGNOSIS — M6281 Muscle weakness (generalized): Secondary | ICD-10-CM | POA: Diagnosis not present

## 2024-06-22 DIAGNOSIS — M19011 Primary osteoarthritis, right shoulder: Secondary | ICD-10-CM | POA: Diagnosis not present

## 2024-06-26 DIAGNOSIS — Z96611 Presence of right artificial shoulder joint: Secondary | ICD-10-CM | POA: Diagnosis not present

## 2024-06-28 DIAGNOSIS — M25611 Stiffness of right shoulder, not elsewhere classified: Secondary | ICD-10-CM | POA: Diagnosis not present

## 2024-06-28 DIAGNOSIS — M19011 Primary osteoarthritis, right shoulder: Secondary | ICD-10-CM | POA: Diagnosis not present

## 2024-06-28 DIAGNOSIS — M6281 Muscle weakness (generalized): Secondary | ICD-10-CM | POA: Diagnosis not present

## 2024-06-28 DIAGNOSIS — Z96611 Presence of right artificial shoulder joint: Secondary | ICD-10-CM | POA: Diagnosis not present
